# Patient Record
Sex: Female | Born: 1955 | Smoking: Former smoker
Health system: Southern US, Community
[De-identification: ages and names within clinical notes are randomized; demographics above are authoritative.]

## PROBLEM LIST (undated history)

## (undated) DIAGNOSIS — R945 Abnormal results of liver function studies: Secondary | ICD-10-CM

## (undated) DIAGNOSIS — F329 Major depressive disorder, single episode, unspecified: Secondary | ICD-10-CM

## (undated) DIAGNOSIS — F1020 Alcohol dependence, uncomplicated: Secondary | ICD-10-CM

## (undated) DIAGNOSIS — R079 Chest pain, unspecified: Secondary | ICD-10-CM

## (undated) DIAGNOSIS — I200 Unstable angina: Secondary | ICD-10-CM

## (undated) DIAGNOSIS — I4891 Unspecified atrial fibrillation: Secondary | ICD-10-CM

## (undated) DIAGNOSIS — J449 Chronic obstructive pulmonary disease, unspecified: Secondary | ICD-10-CM

## (undated) DIAGNOSIS — I739 Peripheral vascular disease, unspecified: Secondary | ICD-10-CM

## (undated) DIAGNOSIS — E871 Hypo-osmolality and hyponatremia: Secondary | ICD-10-CM

## (undated) DIAGNOSIS — R002 Palpitations: Secondary | ICD-10-CM

## (undated) DIAGNOSIS — F1320 Sedative, hypnotic or anxiolytic dependence, uncomplicated: Secondary | ICD-10-CM

## (undated) DIAGNOSIS — C569 Malignant neoplasm of unspecified ovary: Secondary | ICD-10-CM

## (undated) DIAGNOSIS — R0609 Other forms of dyspnea: Secondary | ICD-10-CM

## (undated) HISTORY — PX: ORTHOPEDIC SURGERY: SHX850

## (undated) HISTORY — DX: Sedative, hypnotic or anxiolytic dependence, uncomplicated: F13.20

## (undated) HISTORY — PX: CYSTECTOMY: SUR359

## (undated) HISTORY — DX: Other forms of dyspnea: R06.09

## (undated) HISTORY — DX: Unspecified atrial fibrillation: I48.91

## (undated) HISTORY — DX: Chronic obstructive pulmonary disease, unspecified: J44.9

## (undated) HISTORY — PX: APPENDECTOMY: SHX54

## (undated) HISTORY — DX: Abnormal results of liver function studies: R94.5

## (undated) HISTORY — DX: Hypo-osmolality and hyponatremia: E87.1

## (undated) HISTORY — DX: Major depressive disorder, single episode, unspecified: F32.9

## (undated) HISTORY — DX: Palpitations: R00.2

## (undated) HISTORY — DX: Malignant neoplasm of unspecified ovary: C56.9

## (undated) HISTORY — DX: Peripheral vascular disease, unspecified: I73.9

## (undated) HISTORY — PX: ABDOMINAL HYSTERECTOMY: SHX81

## (undated) HISTORY — DX: Chest pain, unspecified: R07.9

## (undated) HISTORY — DX: Unstable angina: I20.0

## (undated) HISTORY — DX: Alcohol dependence, uncomplicated: F10.20

---

## 2016-07-12 DIAGNOSIS — R945 Abnormal results of liver function studies: Secondary | ICD-10-CM

## 2016-07-12 DIAGNOSIS — F1320 Sedative, hypnotic or anxiolytic dependence, uncomplicated: Secondary | ICD-10-CM

## 2016-07-12 DIAGNOSIS — F1020 Alcohol dependence, uncomplicated: Secondary | ICD-10-CM

## 2016-07-12 DIAGNOSIS — E871 Hypo-osmolality and hyponatremia: Secondary | ICD-10-CM

## 2016-07-12 DIAGNOSIS — R7989 Other specified abnormal findings of blood chemistry: Secondary | ICD-10-CM

## 2016-07-12 HISTORY — DX: Hypo-osmolality and hyponatremia: E87.1

## 2016-07-12 HISTORY — DX: Alcohol dependence, uncomplicated: F10.20

## 2016-07-12 HISTORY — DX: Other specified abnormal findings of blood chemistry: R79.89

## 2016-07-12 HISTORY — DX: Sedative, hypnotic or anxiolytic dependence, uncomplicated: F13.20

## 2017-12-30 DIAGNOSIS — I480 Paroxysmal atrial fibrillation: Secondary | ICD-10-CM | POA: Diagnosis not present

## 2017-12-30 DIAGNOSIS — G8191 Hemiplegia, unspecified affecting right dominant side: Secondary | ICD-10-CM | POA: Diagnosis not present

## 2017-12-30 DIAGNOSIS — K219 Gastro-esophageal reflux disease without esophagitis: Secondary | ICD-10-CM | POA: Diagnosis not present

## 2017-12-30 DIAGNOSIS — I10 Essential (primary) hypertension: Secondary | ICD-10-CM | POA: Diagnosis not present

## 2017-12-30 DIAGNOSIS — J449 Chronic obstructive pulmonary disease, unspecified: Secondary | ICD-10-CM | POA: Diagnosis not present

## 2017-12-31 DIAGNOSIS — I10 Essential (primary) hypertension: Secondary | ICD-10-CM | POA: Diagnosis not present

## 2017-12-31 DIAGNOSIS — J449 Chronic obstructive pulmonary disease, unspecified: Secondary | ICD-10-CM | POA: Diagnosis not present

## 2017-12-31 DIAGNOSIS — I480 Paroxysmal atrial fibrillation: Secondary | ICD-10-CM | POA: Diagnosis not present

## 2017-12-31 DIAGNOSIS — I6789 Other cerebrovascular disease: Secondary | ICD-10-CM | POA: Diagnosis not present

## 2017-12-31 DIAGNOSIS — G8191 Hemiplegia, unspecified affecting right dominant side: Secondary | ICD-10-CM | POA: Diagnosis not present

## 2017-12-31 DIAGNOSIS — K219 Gastro-esophageal reflux disease without esophagitis: Secondary | ICD-10-CM | POA: Diagnosis not present

## 2018-01-01 DIAGNOSIS — I10 Essential (primary) hypertension: Secondary | ICD-10-CM | POA: Diagnosis not present

## 2018-01-01 DIAGNOSIS — G8191 Hemiplegia, unspecified affecting right dominant side: Secondary | ICD-10-CM | POA: Diagnosis not present

## 2018-01-01 DIAGNOSIS — I480 Paroxysmal atrial fibrillation: Secondary | ICD-10-CM | POA: Diagnosis not present

## 2018-01-01 DIAGNOSIS — J449 Chronic obstructive pulmonary disease, unspecified: Secondary | ICD-10-CM | POA: Diagnosis not present

## 2018-01-02 DIAGNOSIS — I480 Paroxysmal atrial fibrillation: Secondary | ICD-10-CM | POA: Diagnosis not present

## 2018-01-02 DIAGNOSIS — J449 Chronic obstructive pulmonary disease, unspecified: Secondary | ICD-10-CM | POA: Diagnosis not present

## 2018-01-02 DIAGNOSIS — G8191 Hemiplegia, unspecified affecting right dominant side: Secondary | ICD-10-CM | POA: Diagnosis not present

## 2018-01-02 DIAGNOSIS — I10 Essential (primary) hypertension: Secondary | ICD-10-CM | POA: Diagnosis not present

## 2018-01-03 ENCOUNTER — Telehealth: Payer: Self-pay | Admitting: Cardiology

## 2018-01-03 DIAGNOSIS — F32A Depression, unspecified: Secondary | ICD-10-CM

## 2018-01-03 DIAGNOSIS — F329 Major depressive disorder, single episode, unspecified: Secondary | ICD-10-CM | POA: Insufficient documentation

## 2018-01-03 DIAGNOSIS — R079 Chest pain, unspecified: Secondary | ICD-10-CM

## 2018-01-03 DIAGNOSIS — I480 Paroxysmal atrial fibrillation: Secondary | ICD-10-CM

## 2018-01-03 DIAGNOSIS — R0600 Dyspnea, unspecified: Secondary | ICD-10-CM

## 2018-01-03 DIAGNOSIS — C569 Malignant neoplasm of unspecified ovary: Secondary | ICD-10-CM | POA: Insufficient documentation

## 2018-01-03 DIAGNOSIS — R0609 Other forms of dyspnea: Secondary | ICD-10-CM

## 2018-01-03 DIAGNOSIS — R002 Palpitations: Secondary | ICD-10-CM

## 2018-01-03 DIAGNOSIS — I739 Peripheral vascular disease, unspecified: Secondary | ICD-10-CM | POA: Insufficient documentation

## 2018-01-03 DIAGNOSIS — J449 Chronic obstructive pulmonary disease, unspecified: Secondary | ICD-10-CM | POA: Insufficient documentation

## 2018-01-03 DIAGNOSIS — I4891 Unspecified atrial fibrillation: Secondary | ICD-10-CM

## 2018-01-03 HISTORY — DX: Dyspnea, unspecified: R06.00

## 2018-01-03 HISTORY — DX: Palpitations: R00.2

## 2018-01-03 HISTORY — DX: Paroxysmal atrial fibrillation: I48.0

## 2018-01-03 HISTORY — DX: Other forms of dyspnea: R06.09

## 2018-01-03 HISTORY — DX: Depression, unspecified: F32.A

## 2018-01-03 HISTORY — DX: Unspecified atrial fibrillation: I48.91

## 2018-01-03 HISTORY — DX: Peripheral vascular disease, unspecified: I73.9

## 2018-01-03 HISTORY — DX: Malignant neoplasm of unspecified ovary: C56.9

## 2018-01-03 HISTORY — DX: Chest pain, unspecified: R07.9

## 2018-01-03 HISTORY — DX: Chronic obstructive pulmonary disease, unspecified: J44.9

## 2018-01-03 NOTE — Telephone Encounter (Signed)
Please call patient regarding whether to start physical therapy prior to doctor appt.

## 2018-01-03 NOTE — Telephone Encounter (Signed)
Patient advised that [facility] set up the physical therapy which means they believe she is stable to start. No need to wait until seen by Dr. Bettina Gavia. Patient verbalized understanding, no further questions.

## 2018-01-08 NOTE — Progress Notes (Signed)
Cardiology Office Note:    Date:  01/09/2018   ID:  Bari Mantis, DOB Mar 26, 1956, MRN [id number]  PCP:  Imagene Riches, NP  Cardiologist:  Shirlee More, MD    Referring MD: Imagene Riches, NP    ASSESSMENT:    1. Paroxysmal atrial fibrillation (HCC)   2. Cerebrovascular accident (CVA), unspecified mechanism ([place])    PLAN:    In order of problems listed above:  1. Clinically she has had no recurrence however if indeed she has had a stroke her risk score would be moderate at 3 she should transition from aspirin and Plavix oral anticoagulant.  Of asked her to be seen quickly by neurology to establish the diagnosis I suspect she did have a stroke by physical examination.  I will see her back in the office in 2 weeks. 2. Confusing terminology possible stroke with normal imaging clinically I think she had an acute neurologic event stroke and I asked her to be seen by neurologist to confirm the diagnosis to guide treatment if she needs an oral anticoagulant.   Next appointment: 2 weeks   Medication Adjustments/Labs and Tests Ordered: Current medicines are reviewed at length with the patient today.  Concerns regarding medicines are outlined above.  Orders Placed This Encounter  Procedures  . Ambulatory referral to Neurology   No orders of the defined types were placed in this encounter.   Chief Complaint  Patient presents with  . Hospitalization Follow-up    per [facility].  Pt has hx of PAF     History of Present Illness:    Brandon Scarbrough is a 62 y.o. female with a hx of chest discomfort , PAF 20 yrs agoand palpitation last seen by dr Jimmie Molly 11/25/15.  Assessment & Plan:  12/05/15 1. Palpitations Echocardiogram Stress Treadmill With Contrast  Holter monitor - 48 hour  2. Chest pain, unspecified Ambulatory referral to Cardiology  Echocardiogram Stress Treadmill With Contrast  3. COPD mixed type ([facility]-HCC) Echocardiogram Stress Treadmill With Contrast  4. Exertional dyspnea  Echocardiogram Stress Treadmill With Contrast   1. Episodic chest discomfort- nonspecific, consistent with angina, multiple coronary risk factors 2. Advanced COPD 3. Palpitations, history of paroxysmal atrial fib in the past  PLAN: Treadmill stress echo to eval for coronary disease, and biventricular function Holter assess nature of arrhythmia,? Paroxysmal A. Fib Ports of smoking cessation stressed  Recently at [facility] ED and admitted with stroke, but discharge summary " Possible stroke with normal imaging" Neurology placed on aspirin and plavix, 2 MRI were normal, no PAF identified during the admission and there is no documentation in Epic.Echo was unremarkable and no arrhythmia was noted.   Compliance with diet, lifestyle and medications: yes  Prior to this admission she is walking 2 miles per day she now uses a walker and is weak on her right side.  She has a history of atrial fibrillation at age 31-year after the birth of her child and took quinidine for several years.  It was stopped she has had no recurrence and was seen by one my previous partners 2-1/2 years ago.  She was in a hospital and was sent to me to make a decision she requires anticoagulation but does not have a firm diagnosis of stroke.  She gets brief episodes where her heart rates braces but is momentary not severe sustained.  She presently is taking aspirin and clopidogrel and is very interested in having dental extractions.  No chest pain syncope or shortness of breath.  Prior to visit I reviewed the [facility] records. Past Medical History:  Diagnosis Date  . Alcohol dependence, uncomplicated ([place]) 94/04/961  . Atrial fibrillation ([place]) 01/03/2018  . Benzodiazepine dependence ([place]) 07/12/2016  . Chest pain 01/03/2018  . COPD (chronic obstructive pulmonary disease) ([place]) 01/03/2018  . Depression 01/03/2018  . Dyspnea on exertion 01/03/2018  . Elevated LFTs 07/12/2016  . Hyponatremia 07/12/2016  . Ovarian cancer ([place])  01/03/2018  . Palpitations 01/03/2018  . PVD (peripheral vascular disease) ([place]) 01/03/2018    Past Surgical History:  Procedure Laterality Date  . ABDOMINAL HYSTERECTOMY    . APPENDECTOMY    . CESAREAN SECTION    . CYSTECTOMY    . ORTHOPEDIC SURGERY      Current Medications: Current Meds  Medication Sig  . ALPRAZolam (XANAX) 1 MG tablet Take 1 mg by mouth every 8 (eight) hours as needed for anxiety.  Marland Kitchen aspirin EC 81 MG tablet Take 81 mg by mouth daily.  Marland Kitchen atorvastatin (LIPITOR) 80 MG tablet TAKE 1 TABLET BY MOUTH ONCE DAILY AT 6 PM  . clopidogrel (PLAVIX) 75 MG tablet TAKE 1 TABLET BY MOUTH ONCE (1) DAILY  . MAGNESIUM OXIDE 400 PO Take 1 tablet by mouth daily.  . metoprolol tartrate (LOPRESSOR) 50 MG tablet Take 0.5 tablets by mouth 2 (two) times daily.  Marland Kitchen omeprazole (PRILOSEC) 20 MG capsule Take 20 mg by mouth daily.  . VENTOLIN HFA 108 (90 Base) MCG/ACT inhaler Inhale 2 puffs into the lungs every 6 (six) hours as needed for wheezing.     Allergies:   Penicillins and Sulfa antibiotics   Social History   Socioeconomic History  . Marital status: Unknown    Spouse name: Not on file  . Number of children: Not on file  . Years of education: Not on file  . Highest education level: Not on file  Occupational History  . Not on file  Social Needs  . Financial resource strain: Not on file  . Food insecurity:    Worry: Not on file    Inability: Not on file  . Transportation needs:    Medical: Not on file    Non-medical: Not on file  Tobacco Use  . Smoking status: Former Smoker    Packs/day: 0.50    Types: Cigarettes  . Smokeless tobacco: Never Used  Substance and Sexual Activity  . Alcohol use: Not Currently  . Drug use: Not Currently  . Sexual activity: Not on file  Lifestyle  . Physical activity:    Days per week: Not on file    Minutes per session: Not on file  . Stress: Not on file  Relationships  . Social connections:    Talks on phone: Not on file    Gets  together: Not on file    Attends religious service: Not on file    Active member of club or organization: Not on file    Attends meetings of clubs or organizations: Not on file    Relationship status: Not on file  Other Topics Concern  . Not on file  Social History Narrative  . Not on file     Family History: The patient's family history includes Aneurysm in her father; Diabetes in her brother and maternal uncle; Stroke in her mother. ROS:   Please see the history of present illness.    All other systems reviewed and are negative.  EKGs/Labs/Other Studies Reviewed:    The following studies were reviewed today  Holter monitor 11/25/15:  CONCLUSIONS: 1.Essentially normal, no significant abnormalities Holter monitor.  2.Mild arrhythmiaas described:Rare PVCs and one 5-beat run of supraventricular tachycardia 3.Symptoms were not reported   Recent Labs: No results found for requested labs within last 8760 hours.  Recent Lipid Panel No results found for: CHOL, TRIG, HDL, CHOLHDL, VLDL, LDLCALC, LDLDIRECT  Physical Exam:    VS:  BP 128/78 (BP Location: Left Arm, Patient Position: Sitting, Cuff Size: Normal)   Pulse 68   Ht 5' 1.5" (1.562 m)   Wt 152 lb 6.4 oz (69.1 kg)   SpO2 97%   BMI 28.33 kg/m     Wt Readings from Last 3 Encounters:  01/09/18 152 lb 6.4 oz (69.1 kg)     GEN:  Well nourished, well developed in no acute distress HEENT: Normal NECK: No JVD; No carotid bruits LYMPHATICS: No lymphadenopathy CARDIAC: RRR, no murmurs, rubs, gallops RESPIRATORY:  Clear to auscultation without rales, wheezing or rhonchi  ABDOMEN: Soft, non-tender, non-distended MUSCULOSKELETAL:  No edema; No deformity  SKIN: Warm and dry NEUROLOGIC:  Alert and oriented x 3, RUE RLE weakness with drift, tongue deviates to the right and speech is mildly dysarthric PSYCHIATRIC:  Normal affect    Signed, Shirlee More, MD  01/09/2018 1:45 PM    [facility]

## 2018-01-09 ENCOUNTER — Encounter: Payer: Self-pay | Admitting: Cardiology

## 2018-01-09 ENCOUNTER — Ambulatory Visit: Payer: BLUE CROSS/BLUE SHIELD | Admitting: Cardiology

## 2018-01-09 VITALS — BP 128/78 | HR 68 | Ht 61.5 in | Wt 152.4 lb

## 2018-01-09 DIAGNOSIS — I639 Cerebral infarction, unspecified: Secondary | ICD-10-CM

## 2018-01-09 DIAGNOSIS — I480 Paroxysmal atrial fibrillation: Secondary | ICD-10-CM | POA: Diagnosis not present

## 2018-01-09 NOTE — Patient Instructions (Signed)
Medication Instructions:  Your physician recommends that you continue on your current medications as directed. Please refer to the Current Medication list given to you today.  Labwork: None ordered  Testing/Procedures: None ordered  Follow-Up: Your physician recommends that you schedule a follow-up appointment in: 2 weeks with Dr. Bettina Gavia   Any Other Special Instructions Will Be Listed Below (If Applicable).  A referral has been placed to [facility] Neurology. They will contact you to schedule an appointment    If you need a refill on your cardiac medications before your next appointment, please call your pharmacy.

## 2018-01-15 ENCOUNTER — Other Ambulatory Visit: Payer: Self-pay

## 2018-01-15 ENCOUNTER — Ambulatory Visit: Payer: BLUE CROSS/BLUE SHIELD | Admitting: Neurology

## 2018-01-15 ENCOUNTER — Encounter: Payer: Self-pay | Admitting: Neurology

## 2018-01-15 VITALS — BP 114/68 | HR 68 | Resp 18 | Ht 61.5 in | Wt 151.0 lb

## 2018-01-15 DIAGNOSIS — R531 Weakness: Secondary | ICD-10-CM | POA: Diagnosis not present

## 2018-01-15 DIAGNOSIS — I480 Paroxysmal atrial fibrillation: Secondary | ICD-10-CM | POA: Diagnosis not present

## 2018-01-15 DIAGNOSIS — R200 Anesthesia of skin: Secondary | ICD-10-CM

## 2018-01-15 DIAGNOSIS — R002 Palpitations: Secondary | ICD-10-CM | POA: Diagnosis not present

## 2018-01-15 HISTORY — DX: Weakness: R53.1

## 2018-01-15 HISTORY — DX: Anesthesia of skin: R20.0

## 2018-01-15 NOTE — Progress Notes (Signed)
[facility]  PATIENT: Megan Acosta DOB: [birth year]  REFERRING DOCTOR OR PCP:  Dr. Bettina Gavia (Cardiology); PCP is Heide Scales SOURCE: patient, notes from Dr. Bettina Gavia  _________________________________   HISTORICAL  CHIEF COMPLAINT:  Chief Complaint  Patient presents with  . Cerebrovascular Accident    On 01/01/18, began having left sided facial tingling, right sided weakness. Seen and treated at [facility] where she sts. MRI showed "mild stroke."  Still has right sided weakness, ambultory with walker. Sts. she has seen Dr. Bettina Gavia (cardiology), and referred to [facility]. Pt. currently on ASA 81mg  and Plavix 75mg  daily, Metoprolol and Lipitor.  Hx. of A-Fib, ETOH abuse (quit 07/12/16),.  Smoked smoking 105/17/fim    HISTORY OF PRESENT ILLNESS:  I had the pleasure seeing your patient, Megan Acosta, and [facility] for neurologic consultation regarding a recent stroke.  On 01/01/2018, she presented to the ED at [facility] after presenting with right sided weakness, arm worse than face and facial tingling.  Initial CT scan was normal.  She also had an MRI of the brain twice during her admission and neither of them showed any evidence of new or old stroke.  Carotid Dopplers showed minimal nonhemodynamically significant stenosis.  Echocardiogram showed borderline concentric left ventricular hypertrophy, normal left ventricular systolic function, borderline right atrial enlargement, right ventricular enlargement.  Bubble study did not show any evidence of shunt.   She was evaluated by physical therapy and felt that she was doing better while in the hospital.  She was discharged after 3 or 4 days.   She was told on discharge summary "You were seen for a possible stroke.   Your imaging was negative, but your symptoms are concerning for stroke".      She was discharged on aspirin and clopidogrel.     She improved some while in the hospital and feels she has  improved a little further the past week.    She is doing PT at [facility] in [place].        She has a history of atrial fibrillation many years ago and reportedly was on quinidine for a few years.   She gets palpitations when she is nervous but no recent EKG has shown any   She saw Dr. Bettina Gavia 01/09/2018 (has seen Dr. Jimmie Molly previously).     She has a history of ETOH abuse but is not currently drinking.  She also quit smoking last year.   She notes some stress with her son having issues with drugs.       REVIEW OF SYSTEMS: Constitutional: No fevers, chills, sweats, or change in appetite Eyes: No visual changes, double vision, eye pain Ear, nose and throat: No hearing loss, ear pain, nasal congestion, sore throat Cardiovascular: As above Respiratory: No shortness of breath at rest or with exertion.   No wheezes GastrointestinaI: No nausea, vomiting, diarrhea, abdominal pain, fecal incontinence Genitourinary: No dysuria, urinary retention or frequency.  No nocturia. Musculoskeletal: No neck pain, back pain Integumentary: No rash, pruritus, skin lesions Neurological: as above Psychiatric: She denies depression but notes some stress Endocrine: No palpitations, diaphoresis, change in appetite, change in weigh or increased thirst Hematologic/Lymphatic: No anemia, purpura, petechiae. Allergic/Immunologic: No itchy/runny eyes, nasal congestion, recent allergic reactions, rashes  ALLERGIES: Allergies  Allergen Reactions  . Penicillins Hives    Patient states she faints after taking medication  . Sulfa Antibiotics Hives    HOME MEDICATIONS:  Current Outpatient Medications:  .  ALPRAZolam Duanne Moron) 1  MG tablet, Take 1 mg by mouth every 8 (eight) hours as needed for anxiety., Disp: , Rfl: 2 .  aspirin EC 81 MG tablet, Take 81 mg by mouth daily., Disp: , Rfl:  .  atorvastatin (LIPITOR) 80 MG tablet, TAKE 1 TABLET BY MOUTH ONCE DAILY AT 6 PM, Disp: , Rfl: 0 .  clopidogrel (PLAVIX) 75 MG  tablet, TAKE 1 TABLET BY MOUTH ONCE (1) DAILY, Disp: , Rfl: 0 .  MAGNESIUM OXIDE 400 PO, Take 1 tablet by mouth daily., Disp: , Rfl:  .  metoprolol tartrate (LOPRESSOR) 50 MG tablet, Take 0.5 tablets by mouth 2 (two) times daily., Disp: , Rfl: 2 .  omeprazole (PRILOSEC) 20 MG capsule, Take 20 mg by mouth daily., Disp: , Rfl:  .  VENTOLIN HFA 108 (90 Base) MCG/ACT inhaler, Inhale 2 puffs into the lungs every 6 (six) hours as needed for wheezing., Disp: , Rfl: 0  PAST MEDICAL HISTORY: Past Medical History:  Diagnosis Date  . Alcohol dependence, uncomplicated ([place]) 61/01/6961  . Atrial fibrillation ([place]) 01/03/2018  . Benzodiazepine dependence ([place]) 07/12/2016  . Chest pain 01/03/2018  . COPD (chronic obstructive pulmonary disease) ([place]) 01/03/2018  . Depression 01/03/2018  . Dyspnea on exertion 01/03/2018  . Elevated LFTs 07/12/2016  . Hyponatremia 07/12/2016  . Ovarian cancer ([place]) 01/03/2018  . Palpitations 01/03/2018  . PVD (peripheral vascular disease) ([place]) 01/03/2018    PAST SURGICAL HISTORY: Past Surgical History:  Procedure Laterality Date  . ABDOMINAL HYSTERECTOMY    . APPENDECTOMY    . CESAREAN SECTION    . CYSTECTOMY    . ORTHOPEDIC SURGERY      FAMILY HISTORY: Family History  Problem Relation Age of Onset  . Stroke Mother   . Aneurysm Father   . Stroke Father   . Diabetes Brother   . Heart attack Brother   . Diabetes Maternal Uncle   . Stroke Sister   . Stroke Sister   . Heart attack Brother   . Diabetes Brother   . Diabetes Brother   . Healthy Brother     SOCIAL HISTORY:  Social History   Socioeconomic History  . Marital status: Unknown    Spouse name: Not on file  . Number of children: Not on file  . Years of education: Not on file  . Highest education level: Not on file  Occupational History  . Not on file  Social Needs  . Financial resource strain: Not on file  . Food insecurity:    Worry: Not on file    Inability: Not on file  . Transportation  needs:    Medical: Not on file    Non-medical: Not on file  Tobacco Use  . Smoking status: Former Smoker    Packs/day: 0.50    Types: Cigarettes  . Smokeless tobacco: Never Used  Substance and Sexual Activity  . Alcohol use: Not Currently    Comment: Pt. reports hx. of alcoholism, last ETOH 07/12/16  . Drug use: Not Currently  . Sexual activity: Not on file  Lifestyle  . Physical activity:    Days per week: Not on file    Minutes per session: Not on file  . Stress: Not on file  Relationships  . Social connections:    Talks on phone: Not on file    Gets together: Not on file    Attends religious service: Not on file    Active member of club or organization: Not on file    Attends meetings  of clubs or organizations: Not on file    Relationship status: Not on file  . Intimate partner violence:    Fear of current or ex partner: Not on file    Emotionally abused: Not on file    Physically abused: Not on file    Forced sexual activity: Not on file  Other Topics Concern  . Not on file  Social History Narrative  . Not on file     PHYSICAL EXAM  Vitals:   01/15/18 1112  BP: 114/68  Pulse: 68  Resp: 18  Weight: 151 lb (68.5 kg)  Height: 5' 1.5" (1.562 m)    Body mass index is 28.07 kg/m.   General: The patient is well-developed and well-nourished and in no acute distress  Eyes:  Funduscopic exam shows normal optic discs and retinal vessels.  Neck: The neck is supple, no carotid bruits are noted.  The neck is nontender.  Cardiovascular: The heart has a regular rate and rhythm with a normal S1 and S2. There were no murmurs, gallops or rubs. Lungs are clear to auscultation.  Skin: Extremities are without significant edema.  Musculoskeletal:  Back is nontender  Neurologic Exam  Mental status: The patient is alert and oriented x 3 at the time of the examination. The patient has apparent normal recent and remote memory, with an apparently normal attention span and  concentration ability.   Speech is normal.  Cranial nerves: Extraocular movements are full. Pupils are equal, round, and reactive to light and accomodation.  Visual fields are full.  Facial symmetry is present.  She reported reduced sensation to touch on the right face versus the left.  She reported reduced forehead vibration on the right versus the left.Facial strength is normal.  Trapezius and sternocleidomastoid strength is normal. No dysarthria is noted.  The tongue is midline, and the patient has symmetric elevation of the soft palate. No obvious hearing deficits are noted.  Motor:  Muscle bulk is normal.   Tone is normal.  Strength appeared normal on the left.  She had very variable strength on the right side of her body.  During formal unilateral testing right strength was +/-  2/5.  With bilateral formal testing she often did better on the right.  With distraction strength was often at least 4+/5.   As examples, when she was putting her papers away at the end of the visit the left and right arm were equal in strength.  When putting her shoe back on she was able to lift her right leg much better than during formal testing.  Arm rolling was symmetric when she continued to move her arms after I moved them for her.     Sensory: She reported reduced sensation to vibration, touch and temperature in the right arm and leg versus the left.  Coordination: Cerebellar testing showed reduced finger-nose-finger but was symmetric heel to shin.  This was variable..  Gait and station: Station is normal.   She had an unusual steppage gait.  When I held onto her while she walked right foot drop was better.  Romberg is negative.   Reflexes: Deep tendon reflexes are symmetric and normal bilaterally.   Plantar responses are flexor.    DIAGNOSTIC DATA (LABS, IMAGING, TESTING) - I reviewed patient records, labs, notes, testing and imaging myself where available.      ASSESSMENT AND PLAN  Paroxysmal atrial  fibrillation (HCC)  Right sided weakness  Right sided numbness  Functional weakness  Palpitations  In summary, Mrs. Mcgillis is a 62 year old woman who presented with right-sided weakness 2 weeks ago.  She was hospitalized for several days and diagnosed with a possible stroke, though there was absolutely no evidence of new or old stroke on MRI.  The MRI was even repeated a day after admission without any changes.  On her exam today, she has clear functional weakness on the right with improved function when distracted.  I cannot rule out that there is some weakness on the right though at times strength appeared normal or near-normal.  Therefore, it is possible that she is having psychogenic stroke symptoms.  Since there is no evidence of changes on MRI, I would not recommend conversion from antiplatelet drugs to stronger anticoagulation.   Continuation on aspirin is reasonable.  If there is no cardiac reason to be on both aspirin and Plavix, then the Plavix could also be discontinued.   I let her know that usually when people have stroke symptoms but no evidence of stroke on MRI and they have variability in their examination that they are neurologically much better off than they may appear.  I would expect her to make continued improvement with physical therapy and I advised her to continue.   I have requested the actual MRI images for personal review  I did not schedule a follow-up but advised her to call our office if she has any significantly new or worsening neurologic symptoms.   Richard A. Felecia Shelling, MD, [facility] 10/23/5788, 3:83 PM Certified in Neurology, Clinical Neurophysiology, Sleep Medicine, Pain Medicine and Neuroimaging  [facility] [address], [place], [place] [address] [phone]

## 2018-01-27 NOTE — Progress Notes (Signed)
Cardiology Office Note:    Date:  01/28/2018   ID:  Erin Obando, DOB Nov 12, 1955, MRN [id number]  PCP:  Imagene Riches, NP  Cardiologist:  Shirlee More, MD    Referring MD: Imagene Riches, NP    ASSESSMENT:    1. Paroxysmal atrial fibrillation (HCC)   2. PVD (peripheral vascular disease) (HCC)   3. Palpitations   4. Dyslipidemia    PLAN:    In order of problems listed above:  1. Stable maintaining sinus rhythm she is having brief episodes of palpitation and I advised her to consider getting iPhone adapter to screen herself for atrial fibrillation.  After neurology consultation I would not commit her to lifelong anticoagulation she will continue aspirin and clopidogrel 2. She has diminished pulse and blood pressure in the right arm will have a vascular duplex performed 3. See discussion under #1 4. Continue statin   Next appointment: 6 months   Medication Adjustments/Labs and Tests Ordered: Current medicines are reviewed at length with the patient today.  Concerns regarding medicines are outlined above.  No orders of the defined types were placed in this encounter.  Meds ordered this encounter  Medications  . rosuvastatin (CRESTOR) 20 MG tablet    Sig: Take 1 tablet (20 mg total) by mouth daily.    Dispense:  90 tablet    Refill:  3    Chief Complaint  Patient presents with  . Follow-up  . Atrial Fibrillation    History of Present Illness:    Murray Guzzetta is a 62 y.o. female with a hx of chest discomfort , PAF 20 yrs ago and palpitation last seen 01/09/18.  ASSESSMENT:    1. Paroxysmal atrial fibrillation (HCC)   2. Cerebrovascular accident (CVA), unspecified mechanism ([place])    PLAN:    1. Clinically she has had no recurrence however if indeed she has had a stroke her risk score would be moderate at 3 she should transition from aspirin and Plavix oral anticoagulant.  Of asked her to be seen quickly by neurology to establish the diagnosis I suspect she did  have a stroke by physical examination.  I will see her back in the office in 2 weeks. 2. Confusing terminology possible stroke with normal imaging clinically I think she had an acute neurologic event stroke and I asked her to be seen by neurologist to confirm the diagnosis to guide treatment if she needs an oral anticoagulant.  She was seen by neurology 01/15/18: In summary, Mrs. Valenza is a 61 year old woman who presented with right-sided weakness 2 weeks ago.  She was hospitalized for several days and diagnosed with a possible stroke, though there was absolutely no evidence of new or old stroke on MRI.  The MRI was even repeated a day after admission without any changes.  On her exam today, she has clear functional weakness on the right with improved function when distracted.  I cannot rule out that there is some weakness on the right though at times strength appeared normal or near-normal.  Therefore, it is possible that she is having psychogenic stroke symptoms.  Since there is no evidence of changes on MRI, I would not recommend conversion from antiplatelet drugs to stronger anticoagulation.   Continuation on aspirin is reasonable.  If there is no cardiac reason to be on both aspirin and Plavix, then the Plavix could also be discontinued.   I let her know that usually when people have stroke symptoms but no evidence of stroke  on MRI and they have variability in their examination that they are neurologically much better off than they may appear.  I would expect her to make continued improvement with physical therapy and I advised her to continue.   I have requested the actual MRI images for personal review  I did not schedule a follow-up but advised her to call our office if she has any significantly new or worsening neurologic symptoms. Richard A. Sater, MD, [facility] 01/15/2018, 1:09 PM  Compliance with diet, lifestyle and medications: Yes  She continues to have right-sided weakness and is  considering applying for disability.  She also anticipates dental extractions and requested a note that I have given her that I see no contraindication.  She has had infrequent brief not severe sustained palpitation and has had no recurrent episode of neurologic deficit syncope chest pain or shortness of breath. Past Medical History:  Diagnosis Date  . Alcohol dependence, uncomplicated ([place]) 28/01/1323  . Atrial fibrillation ([place]) 01/03/2018  . Benzodiazepine dependence ([place]) 07/12/2016  . Chest pain 01/03/2018  . COPD (chronic obstructive pulmonary disease) ([place]) 01/03/2018  . Depression 01/03/2018  . Dyspnea on exertion 01/03/2018  . Elevated LFTs 07/12/2016  . Hyponatremia 07/12/2016  . Ovarian cancer ([place]) 01/03/2018  . Palpitations 01/03/2018  . PVD (peripheral vascular disease) ([place]) 01/03/2018    Past Surgical History:  Procedure Laterality Date  . ABDOMINAL HYSTERECTOMY    . APPENDECTOMY    . CESAREAN SECTION    . CYSTECTOMY    . ORTHOPEDIC SURGERY      Current Medications: Current Meds  Medication Sig  . ALPRAZolam (XANAX) 1 MG tablet Take 1 mg by mouth every 8 (eight) hours as needed for anxiety.  Marland Kitchen MAGNESIUM OXIDE 400 PO Take 1 tablet by mouth daily.  . metoprolol tartrate (LOPRESSOR) 50 MG tablet Take 0.5 tablets by mouth 2 (two) times daily.  Marland Kitchen omeprazole (PRILOSEC) 40 MG capsule TAKE 1 CAPSULE BY MOUTH ONCE (1) DAILY  . VENTOLIN HFA 108 (90 Base) MCG/ACT inhaler Inhale 2 puffs into the lungs every 6 (six) hours as needed for wheezing.  . [DISCONTINUED] atorvastatin (LIPITOR) 80 MG tablet TAKE 1 TABLET BY MOUTH ONCE DAILY AT 6 PM     Allergies:   Penicillins and Sulfa antibiotics   Social History   Socioeconomic History  . Marital status: Unknown    Spouse name: Not on file  . Number of children: Not on file  . Years of education: Not on file  . Highest education level: Not on file  Occupational History  . Not on file  Social Needs  . Financial resource strain: Not  on file  . Food insecurity:    Worry: Not on file    Inability: Not on file  . Transportation needs:    Medical: Not on file    Non-medical: Not on file  Tobacco Use  . Smoking status: Former Smoker    Packs/day: 0.50    Types: Cigarettes  . Smokeless tobacco: Never Used  Substance and Sexual Activity  . Alcohol use: Not Currently    Comment: Pt. reports hx. of alcoholism, last ETOH 07/12/16  . Drug use: Not Currently  . Sexual activity: Not on file  Lifestyle  . Physical activity:    Days per week: Not on file    Minutes per session: Not on file  . Stress: Not on file  Relationships  . Social connections:    Talks on phone: Not on file  Gets together: Not on file    Attends religious service: Not on file    Active member of club or organization: Not on file    Attends meetings of clubs or organizations: Not on file    Relationship status: Not on file  Other Topics Concern  . Not on file  Social History Narrative  . Not on file     Family History: The patient's family history includes Aneurysm in her father; Diabetes in her brother, brother, brother, and maternal uncle; Healthy in her brother; Heart attack in her brother and brother; Stroke in her father, mother, sister, and sister. ROS:   Please see the history of present illness.    All other systems reviewed and are negative.  EKGs/Labs/Other Studies Reviewed:    The following studies were reviewed today:  Recent Labs: No results found for requested labs within last 8760 hours.  Recent Lipid Panel No results found for: CHOL, TRIG, HDL, CHOLHDL, VLDL, LDLCALC, LDLDIRECT  Physical Exam:    VS:  BP 100/70 (BP Location: Right Arm, Patient Position: Sitting, Cuff Size: Normal)   Pulse 62   Ht 5' 1.5" (1.562 m)   Wt 151 lb (68.5 kg)   SpO2 96%   BMI 28.07 kg/m     Wt Readings from Last 3 Encounters:  01/28/18 151 lb (68.5 kg)  01/15/18 151 lb (68.5 kg)  01/09/18 152 lb 6.4 oz (69.1 kg)     GEN:  Well  nourished, well developed in no acute distress HEENT: Normal NECK: No JVD; No carotid bruits LYMPHATICS: No lymphadenopathy CARDIAC: RRR, no murmurs, rubs, gallops RESPIRATORY:  Clear to auscultation without rales, wheezing or rhonchi  ABDOMEN: Soft, non-tender, non-distended MUSCULOSKELETAL:  No edema; No deformity  SKIN: Warm and dry NEUROLOGIC:  Alert and oriented x 3 PSYCHIATRIC:  Normal affect    Signed, Shirlee More, MD  01/28/2018 11:53 AM    [place]

## 2018-01-28 ENCOUNTER — Encounter: Payer: Self-pay | Admitting: Cardiology

## 2018-01-28 ENCOUNTER — Ambulatory Visit: Payer: BLUE CROSS/BLUE SHIELD | Admitting: Cardiology

## 2018-01-28 VITALS — BP 100/70 | HR 62 | Ht 61.5 in | Wt 151.0 lb

## 2018-01-28 DIAGNOSIS — R002 Palpitations: Secondary | ICD-10-CM | POA: Diagnosis not present

## 2018-01-28 DIAGNOSIS — I480 Paroxysmal atrial fibrillation: Secondary | ICD-10-CM

## 2018-01-28 DIAGNOSIS — I739 Peripheral vascular disease, unspecified: Secondary | ICD-10-CM

## 2018-01-28 DIAGNOSIS — E785 Hyperlipidemia, unspecified: Secondary | ICD-10-CM

## 2018-01-28 HISTORY — DX: Hyperlipidemia, unspecified: E78.5

## 2018-01-28 MED ORDER — ROSUVASTATIN CALCIUM 20 MG PO TABS: 20.0000 mg | ORAL_TABLET | Freq: Every day | ORAL | 3 refills | Status: DC

## 2018-01-28 NOTE — Patient Instructions (Addendum)
Medication Instructions:  Your physician has recommended you make the following change in your medication:  STOP atorvastatin  START rosuvastatin (Crestor) 20 mg daily  Labwork: None  Testing/Procedures: Your physician has requested that you have an upper extremity arterial duplex. This test is an ultrasound of the arteries in the arms. It looks at arterial blood flow in the arms. Allow one hour for Upper Arterial scans. There are no restrictions or special instructions  Follow-Up: Your physician wants you to follow-up in: 6 months. You will receive a reminder letter in the mail two months in advance. If you don't receive a letter, please call our office to schedule the follow-up appointment.  Any Other Special Instructions Will Be Listed Below (If Applicable).     If you need a refill on your cardiac medications before your next appointment, please call your pharmacy.    KardiaMobile [email or web address]        FDA-cleared, clinical grade mobile EKG monitor: Jodelle Red is the most clinically-validated mobile EKG used by the world's leading cardiac care medical professionals With Basic service, know instantly if your heart rhythm is normal or if atrial fibrillation is detected, and email the last single EKG recording to yourself or your doctor Premium service, available for purchase through the Kardia app for $9.99 per month or $99 per year, includes unlimited history and storage of your EKG recordings, a monthly EKG summary report to share with your doctor, along with the ability to track your blood pressure, activity and weight Includes one KardiaMobile phone clip FREE SHIPPING: Standard delivery 1-3 business days. Orders placed by 11:00am PST will ship that afternoon. Otherwise, will ship next business day. All orders ship via [employer] from [place], [place]

## 2018-01-31 ENCOUNTER — Ambulatory Visit (HOSPITAL_BASED_OUTPATIENT_CLINIC_OR_DEPARTMENT_OTHER)
Admission: RE | Admit: 2018-01-31 | Discharge: 2018-01-31 | Disposition: A | Discharge status: Home / Self Care | Payer: BLUE CROSS/BLUE SHIELD | Source: Ambulatory Visit | Attending: Cardiology | Admitting: Cardiology

## 2018-01-31 DIAGNOSIS — I739 Peripheral vascular disease, unspecified: Secondary | ICD-10-CM | POA: Diagnosis not present

## 2018-01-31 DIAGNOSIS — I70208 Unspecified atherosclerosis of native arteries of extremities, other extremity: Secondary | ICD-10-CM | POA: Insufficient documentation

## 2018-01-31 DIAGNOSIS — E785 Hyperlipidemia, unspecified: Secondary | ICD-10-CM | POA: Insufficient documentation

## 2018-01-31 DIAGNOSIS — I4891 Unspecified atrial fibrillation: Secondary | ICD-10-CM | POA: Insufficient documentation

## 2018-01-31 NOTE — Progress Notes (Signed)
  Upper extremity arterial unilateral right. Significant stenosis of then proximal brachial artery. Joelene Millin 01/31/2018, 12:47 PM

## 2018-02-05 ENCOUNTER — Telehealth: Payer: Self-pay | Admitting: Cardiology

## 2018-02-05 DIAGNOSIS — R200 Anesthesia of skin: Secondary | ICD-10-CM

## 2018-02-05 DIAGNOSIS — R0989 Other specified symptoms and signs involving the circulatory and respiratory systems: Secondary | ICD-10-CM

## 2018-02-05 DIAGNOSIS — I739 Peripheral vascular disease, unspecified: Secondary | ICD-10-CM

## 2018-02-05 NOTE — Telephone Encounter (Signed)
Patient informed of results. Patient agreed to vascular surgeon referral. Referral placed. Advised patient she would receive a phone call to schedule appointment. Patient verbalized understanding. No further questions.

## 2018-02-05 NOTE — Telephone Encounter (Signed)
-----   Message from Richardo Priest, MD sent at 02/05/2018 11:27 AM EDT ----- Abnormal, likely cuase of weakness RUE refer to vascular surgery dr Trula Slade ----- Message ----- From: Jossie Ng, RN Sent: 02/05/2018  11:08 AM To: Richardo Priest, MD  Please advise on result

## 2018-02-05 NOTE — Telephone Encounter (Signed)
Need results of pvd test

## 2018-03-19 ENCOUNTER — Encounter: Payer: Self-pay | Admitting: Vascular Surgery

## 2018-03-19 ENCOUNTER — Other Ambulatory Visit: Payer: Self-pay

## 2018-03-19 ENCOUNTER — Other Ambulatory Visit: Payer: Self-pay | Admitting: *Deleted

## 2018-03-19 ENCOUNTER — Ambulatory Visit: Payer: BLUE CROSS/BLUE SHIELD | Admitting: Vascular Surgery

## 2018-03-19 ENCOUNTER — Encounter: Payer: Self-pay | Admitting: *Deleted

## 2018-03-19 VITALS — BP 123/76 | HR 64 | Temp 98.9°F | Resp 18 | Ht 61.5 in | Wt 142.0 lb

## 2018-03-19 DIAGNOSIS — I70218 Atherosclerosis of native arteries of extremities with intermittent claudication, other extremity: Secondary | ICD-10-CM

## 2018-03-19 NOTE — H&P (View-Only) (Signed)
Patient name: Megan Acosta MRN: [id number] DOB: 1956/06/29 Sex: female   REASON FOR CONSULT:    Abnormal upper extremity arterial Doppler study.  The consult is requested by Dr. Bettina Gavia.  HPI:   Megan Acosta is a pleasant 62 y.o. female, who presents with weakness and numbness in her right arm.  On my history she describes the pain on the right side of her head, that radiates down her right neck, down to her arm, and then all the way down to her right second toe which feels like it is burning.  With respect to her right upper extremity she describes some paresthesias in the right upper extremity and states that the arm just does not work like it should.  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  Apparently she had been hospitalized in March with a possible stroke but underwent an extensive work-up and had no evidence of an acute stroke.  She did right-sided weakness at that time also.  At that time it also involved her leg however.   I do not get any history of claudication, rest pain, or nonhealing ulcers.  The patient does have a history of paroxysmal atrial fibrillation but to the best of her knowledge she has not had problems with A. fib for some time.  Her risk factors for peripheral vascular disease include a family history of premature cardiovascular disease and a smoking history.  She quit tobacco 2 years ago.  She denies any history of diabetes, hypertension, or hypercholesterolemia.  Past Medical History:  Diagnosis Date  . Alcohol dependence, uncomplicated ([place]) 75/06/1637  . Atrial fibrillation ([place]) 01/03/2018  . Benzodiazepine dependence ([place]) 07/12/2016  . Chest pain 01/03/2018  . COPD (chronic obstructive pulmonary disease) ([place]) 01/03/2018  . Depression 01/03/2018  . Dyspnea on exertion 01/03/2018  . Elevated LFTs 07/12/2016  . Hyponatremia 07/12/2016  . Ovarian cancer ([place]) 01/03/2018  . Palpitations  01/03/2018  . PVD (peripheral vascular disease) ([place]) 01/03/2018    Family History  Problem Relation Age of Onset  . Stroke Mother   . Aneurysm Father   . Stroke Father   . Diabetes Brother   . Heart attack Brother   . Diabetes Maternal Uncle   . Heart disease Sister   . Stroke Sister   . Stroke Sister   . Heart attack Brother   . Heart disease Brother   . Diabetes Brother   . Diabetes Brother   . Healthy Brother     SOCIAL HISTORY: Social History   Socioeconomic History  . Marital status: Unknown    Spouse name: Not on file  . Number of children: Not on file  . Years of education: Not on file  . Highest education level: Not on file  Occupational History  . Not on file  Social Needs  . Financial resource strain: Not on file  . Food insecurity:    Worry: Not on file    Inability: Not on file  . Transportation needs:    Medical: Not on file    Non-medical: Not on file  Tobacco Use  . Smoking status: Former Smoker    Packs/day: 0.50    Types: Cigarettes  . Smokeless tobacco: Never Used  Substance and Sexual Activity  . Alcohol use: Not Currently    Comment: Pt. reports hx. of alcoholism, last ETOH 07/12/16  . Drug use: Not Currently  . Sexual  activity: Not on file  Lifestyle  . Physical activity:    Days per week: Not on file    Minutes per session: Not on file  . Stress: Not on file  Relationships  . Social connections:    Talks on phone: Not on file    Gets together: Not on file    Attends religious service: Not on file    Active member of club or organization: Not on file    Attends meetings of clubs or organizations: Not on file    Relationship status: Not on file  . Intimate partner violence:    Fear of current or ex partner: Not on file    Emotionally abused: Not on file    Physically abused: Not on file    Forced sexual activity: Not on file  Other Topics Concern  . Not on file  Social History Narrative  . Not on file    Allergies  Allergen  Reactions  . Penicillins Hives    Patient states she faints after taking medication  . Sulfa Antibiotics Hives    Current Outpatient Medications  Medication Sig Dispense Refill  . ALPRAZolam (XANAX) 1 MG tablet Take 1 mg by mouth every 8 (eight) hours as needed for anxiety.  2  . aspirin EC 81 MG tablet Take 81 mg by mouth daily.    Marland Kitchen MAGNESIUM OXIDE 400 PO Take 1 tablet by mouth daily.    . rosuvastatin (CRESTOR) 20 MG tablet Take 1 tablet (20 mg total) by mouth daily. 90 tablet 3  . clopidogrel (PLAVIX) 75 MG tablet TAKE 1 TABLET BY MOUTH ONCE (1) DAILY  0  . metoprolol tartrate (LOPRESSOR) 50 MG tablet Take 0.5 tablets by mouth 2 (two) times daily.  2  . omeprazole (PRILOSEC) 40 MG capsule TAKE 1 CAPSULE BY MOUTH ONCE (1) DAILY  2  . VENTOLIN HFA 108 (90 Base) MCG/ACT inhaler Inhale 2 puffs into the lungs every 6 (six) hours as needed for wheezing.  0   No current facility-administered medications for this visit.     REVIEW OF SYSTEMS:  [X]  denotes positive finding, [ ]  denotes negative finding Cardiac  Comments:  Chest pain or chest pressure:    Shortness of breath upon exertion: x   Short of breath when lying flat:    Irregular heart rhythm: x       Vascular    Pain in calf, thigh, or hip brought on by ambulation:    Pain in feet at night that wakes you up from your sleep:     Blood clot in your veins:    Leg swelling:         Pulmonary    Oxygen at home:    Productive cough:  x   Wheezing:  x       Neurologic    Sudden weakness in arms or legs:  x   Sudden numbness in arms or legs:  x   Sudden onset of difficulty speaking or slurred speech: x   Temporary loss of vision in one eye:     Problems with dizziness:         Gastrointestinal    Blood in stool:     Vomited blood:         Genitourinary    Burning when urinating:     Blood in urine:        Psychiatric    Major depression:         Hematologic  Bleeding problems:    Problems with blood clotting  too easily:        Skin    Rashes or ulcers:        Constitutional    Fever or chills:     PHYSICAL EXAM:   There were no vitals filed for this visit.  GENERAL: The patient is a well-nourished female, in no acute distress. The vital signs are documented above. CARDIAC: There is a regular rate and rhythm.  VASCULAR: I do not detect carotid bruits or supraclavicular bruits. I cannot palpate a right radial pulse. She has a palpable left brachial and radial pulse. She has palpable femoral pulses.  I cannot palpate popliteal or pedal pulses. On the right side she has a biphasic posterior tibial signal with the Doppler and a brisk dorsalis pedis signal.  On the left side she has brisk monophasic Doppler signals in the dorsalis pedis and posterior tibial positions. PULMONARY: There is good air exchange bilaterally without wheezing or rales. ABDOMEN: Soft and non-tender with normal pitched bowel sounds.  MUSCULOSKELETAL: There are no major deformities or cyanosis. NEUROLOGIC: No focal weakness or paresthesias are detected. SKIN: There are no ulcers or rashes noted. PSYCHIATRIC: The patient has a normal affect.  DATA:    CAROTID DUPLEX: I have reviewed the carotid duplex scan that was done at [facility] on 12/31/2017.  There was no significant carotid disease bilaterally.  Both vertebral arteries were patent with antegrade flow.  UPPER EXTREMITY DUPLEX: I have also reviewed the upper extremity arterial duplex that was done on 01/31/2018.  This showed triphasic waveforms in the right subclavian artery with a biphasic axillary signal and monophasic signals from the brachial level down.  There was a hemodynamically significant stenosis noted in the proximal right brachial artery.  Hello of the right  MEDICAL ISSUES:   ATHEROSCLEROSIS OF RIGHT BRACHIAL ARTERY: Duplex scan and physical exam suggest significant disease of her brachial artery on the right side which likely explains her symptoms  of weakness and numbness.  Her carotid duplex scan was completely normal.  Given that her symptoms are quite disabling I have recommended we proceed with an arteriogram.  I have reviewed with the patient the indications for arteriography. In addition, I have reviewed the potential complications of arteriography including but not limited to: Bleeding, arterial injury, arterial thrombosis, dye action, renal insufficiency, or other unpredictable medical problems. I have explained to the patient that if we find disease amenable to angioplasty we could potentially address this at the same time. I have discussed the potential complications of angioplasty and stenting, including but not limited to: Bleeding, arterial thrombosis, arterial injury, dissection, or the need for surgical intervention.  Her arteriogram is scheduled for 03/28/2018.  I will make further recommendations pending these results.  If she is not a candidate for endovascular intervention and needs to be considered for bypass she would obviously need vein map preoperatively.  She is on aspirin, Plavix, and a statin.   Deitra Mayo [facility] [phone]

## 2018-03-19 NOTE — Progress Notes (Signed)
Acosta name: Megan Acosta MRN: [id number] DOB: 1955-12-11 Sex: female   REASON FOR CONSULT:    Abnormal upper extremity arterial Doppler study.  Megan consult is requested by Dr. Bettina Gavia.  HPI:   Megan Acosta is a pleasant 62 y.o. female, who presents with weakness and numbness in her right arm.  On my history she describes Megan pain on Megan right side of her head, that radiates down her right neck, down to her arm, and then all Megan way down to her right second toe which feels like it is burning.  With respect to her right upper extremity she describes some paresthesias in Megan right upper extremity and states that Megan arm just does not work like it should.  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  Apparently she had been hospitalized in March with a possible stroke but underwent an extensive work-up and had no evidence of an acute stroke.  She did right-sided weakness at that time also.  At that time it also involved her leg however.   I do not get any history of claudication, rest pain, or nonhealing ulcers.  Megan Acosta does have a history of paroxysmal atrial fibrillation but to Megan best of her knowledge she has not had problems with A. fib for some time.  Her risk factors for peripheral vascular disease include a family history of premature cardiovascular disease and a smoking history.  She quit tobacco 2 years ago.  She denies any history of diabetes, hypertension, or hypercholesterolemia.  Past Medical History:  Diagnosis Date  . Alcohol dependence, uncomplicated ([place]) 68/10/2749  . Atrial fibrillation ([place]) 01/03/2018  . Benzodiazepine dependence ([place]) 07/12/2016  . Chest pain 01/03/2018  . COPD (chronic obstructive pulmonary disease) ([place]) 01/03/2018  . Depression 01/03/2018  . Dyspnea on exertion 01/03/2018  . Elevated LFTs 07/12/2016  . Hyponatremia 07/12/2016  . Ovarian cancer ([place]) 01/03/2018  . Palpitations  01/03/2018  . PVD (peripheral vascular disease) ([place]) 01/03/2018    Family History  Problem Relation Age of Onset  . Stroke Mother   . Aneurysm Father   . Stroke Father   . Diabetes Brother   . Heart attack Brother   . Diabetes Maternal Uncle   . Heart disease Sister   . Stroke Sister   . Stroke Sister   . Heart attack Brother   . Heart disease Brother   . Diabetes Brother   . Diabetes Brother   . Healthy Brother     SOCIAL HISTORY: Social History   Socioeconomic History  . Marital status: Unknown    Spouse name: Not on file  . Number of children: Not on file  . Years of education: Not on file  . Highest education level: Not on file  Occupational History  . Not on file  Social Needs  . Financial resource strain: Not on file  . Food insecurity:    Worry: Not on file    Inability: Not on file  . Transportation needs:    Medical: Not on file    Non-medical: Not on file  Tobacco Use  . Smoking status: Former Smoker    Packs/day: 0.50    Types: Cigarettes  . Smokeless tobacco: Never Used  Substance and Sexual Activity  . Alcohol use: Not Currently    Comment: Pt. reports hx. of alcoholism, last ETOH 07/12/16  . Drug use: Not Currently  . Sexual  activity: Not on file  Lifestyle  . Physical activity:    Days per week: Not on file    Minutes per session: Not on file  . Stress: Not on file  Relationships  . Social connections:    Talks on phone: Not on file    Gets together: Not on file    Attends religious service: Not on file    Active member of club or organization: Not on file    Attends meetings of clubs or organizations: Not on file    Relationship status: Not on file  . Intimate partner violence:    Fear of current or ex partner: Not on file    Emotionally abused: Not on file    Physically abused: Not on file    Forced sexual activity: Not on file  Other Topics Concern  . Not on file  Social History Narrative  . Not on file    Allergies  Allergen  Reactions  . Penicillins Hives    Acosta states she faints after taking medication  . Sulfa Antibiotics Hives    Current Outpatient Medications  Medication Sig Dispense Refill  . ALPRAZolam (XANAX) 1 MG tablet Take 1 mg by mouth every 8 (eight) hours as needed for anxiety.  2  . aspirin EC 81 MG tablet Take 81 mg by mouth daily.    Marland Kitchen MAGNESIUM OXIDE 400 PO Take 1 tablet by mouth daily.    . rosuvastatin (CRESTOR) 20 MG tablet Take 1 tablet (20 mg total) by mouth daily. 90 tablet 3  . clopidogrel (PLAVIX) 75 MG tablet TAKE 1 TABLET BY MOUTH ONCE (1) DAILY  0  . metoprolol tartrate (LOPRESSOR) 50 MG tablet Take 0.5 tablets by mouth 2 (two) times daily.  2  . omeprazole (PRILOSEC) 40 MG capsule TAKE 1 CAPSULE BY MOUTH ONCE (1) DAILY  2  . VENTOLIN HFA 108 (90 Base) MCG/ACT inhaler Inhale 2 puffs into Megan lungs every 6 (six) hours as needed for wheezing.  0   No current facility-administered medications for this visit.     REVIEW OF SYSTEMS:  [X]  denotes positive finding, [ ]  denotes negative finding Cardiac  Comments:  Chest pain or chest pressure:    Shortness of breath upon exertion: x   Short of breath when lying flat:    Irregular heart rhythm: x       Vascular    Pain in calf, thigh, or hip brought on by ambulation:    Pain in feet at night that wakes you up from your sleep:     Blood clot in your veins:    Leg swelling:         Pulmonary    Oxygen at home:    Productive cough:  x   Wheezing:  x       Neurologic    Sudden weakness in arms or legs:  x   Sudden numbness in arms or legs:  x   Sudden onset of difficulty speaking or slurred speech: x   Temporary loss of vision in one eye:     Problems with dizziness:         Gastrointestinal    Blood in stool:     Vomited blood:         Genitourinary    Burning when urinating:     Blood in urine:        Psychiatric    Major depression:         Hematologic  Bleeding problems:    Problems with blood clotting  too easily:        Skin    Rashes or ulcers:        Constitutional    Fever or chills:     PHYSICAL EXAM:   There were no vitals filed for this visit.  GENERAL: Megan Acosta is a well-nourished female, in no acute distress. Megan vital signs are documented above. CARDIAC: There is a regular rate and rhythm.  VASCULAR: I do not detect carotid bruits or supraclavicular bruits. I cannot palpate a right radial pulse. She has a palpable left brachial and radial pulse. She has palpable femoral pulses.  I cannot palpate popliteal or pedal pulses. On Megan right side she has a biphasic posterior tibial signal with Megan Doppler and a brisk dorsalis pedis signal.  On Megan left side she has brisk monophasic Doppler signals in Megan dorsalis pedis and posterior tibial positions. PULMONARY: There is good air exchange bilaterally without wheezing or rales. ABDOMEN: Soft and non-tender with normal pitched bowel sounds.  MUSCULOSKELETAL: There are no major deformities or cyanosis. NEUROLOGIC: No focal weakness or paresthesias are detected. SKIN: There are no ulcers or rashes noted. PSYCHIATRIC: Megan Acosta has a normal affect.  DATA:    CAROTID DUPLEX: I have reviewed Megan carotid duplex scan that was done at [facility] on 12/31/2017.  There was no significant carotid disease bilaterally.  Both vertebral arteries were patent with antegrade flow.  UPPER EXTREMITY DUPLEX: I have also reviewed Megan upper extremity arterial duplex that was done on 01/31/2018.  This showed triphasic waveforms in Megan right subclavian artery with a biphasic axillary signal and monophasic signals from Megan brachial level down.  There was a hemodynamically significant stenosis noted in Megan proximal right brachial artery.  Hello of Megan right  MEDICAL ISSUES:   ATHEROSCLEROSIS OF RIGHT BRACHIAL ARTERY: Duplex scan and physical exam suggest significant disease of her brachial artery on Megan right side which likely explains her symptoms  of weakness and numbness.  Her carotid duplex scan was completely normal.  Given that her symptoms are quite disabling I have recommended we proceed with an arteriogram.  I have reviewed with Megan Acosta Megan indications for arteriography. In addition, I have reviewed Megan potential complications of arteriography including but not limited to: Bleeding, arterial injury, arterial thrombosis, dye action, renal insufficiency, or other unpredictable medical problems. I have explained to Megan Acosta that if we find disease amenable to angioplasty we could potentially address this at Megan same time. I have discussed Megan potential complications of angioplasty and stenting, including but not limited to: Bleeding, arterial thrombosis, arterial injury, dissection, or Megan need for surgical intervention.  Her arteriogram is scheduled for 03/28/2018.  I will make further recommendations pending these results.  If she is not a candidate for endovascular intervention and needs to be considered for bypass she would obviously need vein map preoperatively.  She is on aspirin, Plavix, and a statin.   Deitra Mayo [facility] [phone]

## 2018-03-28 ENCOUNTER — Encounter (HOSPITAL_COMMUNITY)
Admission: RE | Disposition: A | Discharge status: Home / Self Care | Payer: Self-pay | Source: Home / Self Care | Attending: Vascular Surgery

## 2018-03-28 ENCOUNTER — Ambulatory Visit (HOSPITAL_COMMUNITY): Payer: BLUE CROSS/BLUE SHIELD

## 2018-03-28 ENCOUNTER — Inpatient Hospital Stay (HOSPITAL_COMMUNITY)
Admission: RE | Admit: 2018-03-28 | Discharge: 2018-04-02 | DRG: 253 | Disposition: A | Discharge status: Home / Self Care | Payer: BLUE CROSS/BLUE SHIELD | Attending: Vascular Surgery | Admitting: Vascular Surgery

## 2018-03-28 ENCOUNTER — Telehealth: Payer: Self-pay | Admitting: Vascular Surgery

## 2018-03-28 ENCOUNTER — Encounter (HOSPITAL_COMMUNITY): Payer: Self-pay | Admitting: Cardiology

## 2018-03-28 DIAGNOSIS — I708 Atherosclerosis of other arteries: Secondary | ICD-10-CM | POA: Diagnosis present

## 2018-03-28 DIAGNOSIS — E785 Hyperlipidemia, unspecified: Secondary | ICD-10-CM | POA: Diagnosis present

## 2018-03-28 DIAGNOSIS — I739 Peripheral vascular disease, unspecified: Secondary | ICD-10-CM

## 2018-03-28 DIAGNOSIS — I771 Stricture of artery: Secondary | ICD-10-CM | POA: Diagnosis present

## 2018-03-28 DIAGNOSIS — D649 Anemia, unspecified: Secondary | ICD-10-CM | POA: Diagnosis present

## 2018-03-28 DIAGNOSIS — R55 Syncope and collapse: Secondary | ICD-10-CM | POA: Diagnosis not present

## 2018-03-28 DIAGNOSIS — I25110 Atherosclerotic heart disease of native coronary artery with unstable angina pectoris: Secondary | ICD-10-CM | POA: Diagnosis present

## 2018-03-28 DIAGNOSIS — Z90710 Acquired absence of both cervix and uterus: Secondary | ICD-10-CM

## 2018-03-28 DIAGNOSIS — Z7982 Long term (current) use of aspirin: Secondary | ICD-10-CM | POA: Diagnosis not present

## 2018-03-28 DIAGNOSIS — Z8249 Family history of ischemic heart disease and other diseases of the circulatory system: Secondary | ICD-10-CM

## 2018-03-28 DIAGNOSIS — R9439 Abnormal result of other cardiovascular function study: Secondary | ICD-10-CM | POA: Diagnosis not present

## 2018-03-28 DIAGNOSIS — Z880 Allergy status to penicillin: Secondary | ICD-10-CM

## 2018-03-28 DIAGNOSIS — Z8543 Personal history of malignant neoplasm of ovary: Secondary | ICD-10-CM | POA: Diagnosis not present

## 2018-03-28 DIAGNOSIS — R000 Tachycardia, unspecified: Secondary | ICD-10-CM | POA: Diagnosis not present

## 2018-03-28 DIAGNOSIS — I200 Unstable angina: Secondary | ICD-10-CM

## 2018-03-28 DIAGNOSIS — L7632 Postprocedural hematoma of skin and subcutaneous tissue following other procedure: Secondary | ICD-10-CM | POA: Diagnosis not present

## 2018-03-28 DIAGNOSIS — Z882 Allergy status to sulfonamides status: Secondary | ICD-10-CM

## 2018-03-28 DIAGNOSIS — E876 Hypokalemia: Secondary | ICD-10-CM | POA: Diagnosis present

## 2018-03-28 DIAGNOSIS — I952 Hypotension due to drugs: Secondary | ICD-10-CM | POA: Diagnosis not present

## 2018-03-28 DIAGNOSIS — Z7901 Long term (current) use of anticoagulants: Secondary | ICD-10-CM | POA: Diagnosis not present

## 2018-03-28 DIAGNOSIS — T443X5A Adverse effect of other parasympatholytics [anticholinergics and antimuscarinics] and spasmolytics, initial encounter: Secondary | ICD-10-CM | POA: Diagnosis not present

## 2018-03-28 DIAGNOSIS — I480 Paroxysmal atrial fibrillation: Secondary | ICD-10-CM | POA: Diagnosis present

## 2018-03-28 DIAGNOSIS — R079 Chest pain, unspecified: Secondary | ICD-10-CM | POA: Diagnosis not present

## 2018-03-28 DIAGNOSIS — Z87891 Personal history of nicotine dependence: Secondary | ICD-10-CM

## 2018-03-28 DIAGNOSIS — Y92230 Patient room in hospital as the place of occurrence of the external cause: Secondary | ICD-10-CM | POA: Diagnosis not present

## 2018-03-28 DIAGNOSIS — J449 Chronic obstructive pulmonary disease, unspecified: Secondary | ICD-10-CM | POA: Diagnosis present

## 2018-03-28 DIAGNOSIS — Z79899 Other long term (current) drug therapy: Secondary | ICD-10-CM

## 2018-03-28 DIAGNOSIS — F419 Anxiety disorder, unspecified: Secondary | ICD-10-CM | POA: Diagnosis present

## 2018-03-28 HISTORY — DX: Peripheral vascular disease, unspecified: I73.9

## 2018-03-28 HISTORY — PX: UPPER EXTREMITY ANGIOGRAPHY: CATH118270

## 2018-03-28 HISTORY — PX: AORTIC ARCH ANGIOGRAPHY: CATH118224

## 2018-03-28 HISTORY — PX: PERIPHERAL VASCULAR BALLOON ANGIOPLASTY: CATH118281

## 2018-03-28 LAB — POCT I-STAT, CHEM 8
BUN: 14 mg/dL (ref 6–20)
CREATININE: 0.7 mg/dL (ref 0.44–1.00)
Calcium, Ion: 1.1 mmol/L — ABNORMAL LOW (ref 1.15–1.40)
Chloride: 103 mmol/L (ref 101–111)
GLUCOSE: 98 mg/dL (ref 65–99)
HEMATOCRIT: 35 % — AB (ref 36.0–46.0)
HEMOGLOBIN: 11.9 g/dL — AB (ref 12.0–15.0)
POTASSIUM: 3.8 mmol/L (ref 3.5–5.1)
Sodium: 141 mmol/L (ref 135–145)
TCO2: 27 mmol/L (ref 22–32)

## 2018-03-28 LAB — POCT ACTIVATED CLOTTING TIME
ACTIVATED CLOTTING TIME: 252 s
ACTIVATED CLOTTING TIME: 169 s
Activated Clotting Time: 224 seconds

## 2018-03-28 LAB — BASIC METABOLIC PANEL
ANION GAP: 6 (ref 5–15)
BUN: 10 mg/dL (ref 6–20)
CO2: 23 mmol/L (ref 22–32)
Calcium: 7.4 mg/dL — ABNORMAL LOW (ref 8.9–10.3)
Chloride: 112 mmol/L — ABNORMAL HIGH (ref 101–111)
Creatinine, Ser: 0.59 mg/dL (ref 0.44–1.00)
GFR calc Af Amer: >60 mL/min (ref >60)
GLUCOSE: 113 mg/dL — AB (ref 65–99)
Potassium: 3.1 mmol/L — ABNORMAL LOW (ref 3.5–5.1)
Sodium: 141 mmol/L (ref 135–145)

## 2018-03-28 LAB — TROPONIN I

## 2018-03-28 LAB — CBC
HCT: 30.3 % — ABNORMAL LOW (ref 36.0–46.0)
Hemoglobin: 9.8 g/dL — ABNORMAL LOW (ref 12.0–15.0)
MCH: 29.2 pg (ref 26.0–34.0)
MCHC: 32.3 g/dL (ref 30.0–36.0)
MCV: 90.2 fL (ref 78.0–100.0)
PLATELETS: 153 10*3/uL (ref 150–400)
RBC: 3.36 MIL/uL — ABNORMAL LOW (ref 3.87–5.11)
RDW: 13.2 % (ref 11.5–15.5)
WBC: 10.1 10*3/uL (ref 4.0–10.5)

## 2018-03-28 SURGERY — AORTIC ARCH ANGIOGRAPHY
Anesthesia: LOCAL | Laterality: Right

## 2018-03-28 MED ORDER — ASPIRIN EC 81 MG PO TBEC
81.0000 mg | DELAYED_RELEASE_TABLET | Freq: Every day | ORAL | Status: DC
  Administered 2018-03-29 – 2018-04-02 (×4): 81 mg via ORAL
  Filled 2018-03-28 (×4): qty 1

## 2018-03-28 MED ORDER — IOPAMIDOL (ISOVUE-370) INJECTION 76%
80.0000 mL | Freq: Once | INTRAVENOUS | Status: AC | PRN
  Administered 2018-03-28: 80 mL via INTRAVENOUS

## 2018-03-28 MED ORDER — SODIUM CHLORIDE 0.9 % IV SOLN
INTRAVENOUS | Status: DC
  Administered 2018-03-28: 07:00:00 via INTRAVENOUS

## 2018-03-28 MED ORDER — IODIXANOL 320 MG/ML IV SOLN
INTRAVENOUS | Status: DC | PRN
  Administered 2018-03-28: 150 mL via INTRA_ARTERIAL

## 2018-03-28 MED ORDER — LIDOCAINE HCL (PF) 1 % IJ SOLN
INTRAMUSCULAR | Status: DC | PRN
  Administered 2018-03-28: 25 mL

## 2018-03-28 MED ORDER — ALBUTEROL SULFATE (2.5 MG/3ML) 0.083% IN NEBU: 3.0000 mL | INHALATION_SOLUTION | Freq: Four times a day (QID) | RESPIRATORY_TRACT | Status: DC | PRN

## 2018-03-28 MED ORDER — ONDANSETRON HCL 4 MG/2ML IJ SOLN
4.0000 mg | Freq: Four times a day (QID) | INTRAMUSCULAR | Status: DC | PRN
  Administered 2018-03-28 – 2018-04-01 (×6): 4 mg via INTRAVENOUS
  Filled 2018-03-28 (×5): qty 2

## 2018-03-28 MED ORDER — ONDANSETRON HCL 4 MG/2ML IJ SOLN
INTRAMUSCULAR | Status: AC
  Filled 2018-03-28: qty 2

## 2018-03-28 MED ORDER — ATROPINE SULFATE 1 MG/10ML IJ SOSY
1.0000 mg | PREFILLED_SYRINGE | Freq: Once | INTRAMUSCULAR | Status: AC
  Administered 2018-03-28: 1 mg via INTRAVENOUS

## 2018-03-28 MED ORDER — HEPARIN (PORCINE) IN NACL 2-0.9 UNITS/ML
INTRAMUSCULAR | Status: AC | PRN
  Administered 2018-03-28: 1000 mL

## 2018-03-28 MED ORDER — LABETALOL HCL 5 MG/ML IV SOLN: 10.0000 mg | INTRAVENOUS | Status: DC | PRN

## 2018-03-28 MED ORDER — FENTANYL CITRATE (PF) 100 MCG/2ML IJ SOLN
INTRAMUSCULAR | Status: DC | PRN
  Administered 2018-03-28: 50 ug via INTRAVENOUS

## 2018-03-28 MED ORDER — SODIUM CHLORIDE 0.9 % WEIGHT BASED INFUSION: 1.0000 mL/kg/h | INTRAVENOUS | Status: AC

## 2018-03-28 MED ORDER — PANTOPRAZOLE SODIUM 40 MG PO TBEC
40.0000 mg | DELAYED_RELEASE_TABLET | Freq: Every day | ORAL | Status: DC
  Administered 2018-03-29 – 2018-04-01 (×3): 40 mg via ORAL
  Filled 2018-03-28 (×3): qty 1

## 2018-03-28 MED ORDER — BISACODYL 10 MG RE SUPP: 10.0000 mg | Freq: Every day | RECTAL | Status: DC | PRN

## 2018-03-28 MED ORDER — CLOPIDOGREL BISULFATE 75 MG PO TABS
75.0000 mg | ORAL_TABLET | Freq: Every day | ORAL | Status: DC
  Administered 2018-03-29 – 2018-04-02 (×5): 75 mg via ORAL
  Filled 2018-03-28 (×5): qty 1

## 2018-03-28 MED ORDER — FENTANYL CITRATE (PF) 100 MCG/2ML IJ SOLN
INTRAMUSCULAR | Status: AC
  Filled 2018-03-28: qty 2

## 2018-03-28 MED ORDER — ALPRAZOLAM 0.5 MG PO TABS
1.0000 mg | ORAL_TABLET | Freq: Three times a day (TID) | ORAL | Status: DC | PRN
  Administered 2018-03-29 – 2018-04-02 (×8): 1 mg via ORAL
  Filled 2018-03-28 (×8): qty 2

## 2018-03-28 MED ORDER — SODIUM CHLORIDE 0.9 % IV SOLN
250.0000 mL | INTRAVENOUS | Status: DC | PRN
  Administered 2018-04-01: 250 mL via INTRAVENOUS

## 2018-03-28 MED ORDER — MORPHINE SULFATE (PF) 2 MG/ML IV SOLN: 1.0000 mg | INTRAVENOUS | Status: DC | PRN

## 2018-03-28 MED ORDER — SODIUM CHLORIDE 0.9% FLUSH
3.0000 mL | Freq: Two times a day (BID) | INTRAVENOUS | Status: DC
  Administered 2018-03-30 – 2018-04-01 (×2): 3 mL via INTRAVENOUS

## 2018-03-28 MED ORDER — ACETAMINOPHEN 325 MG PO TABS
ORAL_TABLET | ORAL | Status: AC
  Filled 2018-03-28: qty 2

## 2018-03-28 MED ORDER — HEPARIN (PORCINE) IN NACL 1000-0.9 UT/500ML-% IV SOLN
INTRAVENOUS | Status: AC
  Filled 2018-03-28: qty 1000

## 2018-03-28 MED ORDER — MIDAZOLAM HCL 2 MG/2ML IJ SOLN
INTRAMUSCULAR | Status: DC | PRN
  Administered 2018-03-28: 1 mg via INTRAVENOUS

## 2018-03-28 MED ORDER — POTASSIUM CHLORIDE CRYS ER 20 MEQ PO TBCR
40.0000 meq | EXTENDED_RELEASE_TABLET | Freq: Once | ORAL | Status: AC
  Administered 2018-03-28: 40 meq via ORAL
  Filled 2018-03-28: qty 2

## 2018-03-28 MED ORDER — HEPARIN SODIUM (PORCINE) 1000 UNIT/ML IJ SOLN
INTRAMUSCULAR | Status: DC | PRN
  Administered 2018-03-28: 6000 [IU] via INTRAVENOUS

## 2018-03-28 MED ORDER — HEPARIN SODIUM (PORCINE) 1000 UNIT/ML IJ SOLN
INTRAMUSCULAR | Status: AC
  Filled 2018-03-28: qty 1

## 2018-03-28 MED ORDER — POTASSIUM CHLORIDE CRYS ER 20 MEQ PO TBCR
40.0000 meq | EXTENDED_RELEASE_TABLET | Freq: Once | ORAL | Status: AC
  Administered 2018-03-28: 40 meq via ORAL
  Filled 2018-03-28 (×2): qty 2

## 2018-03-28 MED ORDER — SODIUM CHLORIDE 0.9% FLUSH: 3.0000 mL | INTRAVENOUS | Status: DC | PRN

## 2018-03-28 MED ORDER — ACETAMINOPHEN 325 MG PO TABS
650.0000 mg | ORAL_TABLET | ORAL | Status: DC | PRN
  Administered 2018-03-28 – 2018-04-01 (×7): 650 mg via ORAL
  Filled 2018-03-28 (×6): qty 2

## 2018-03-28 MED ORDER — ROSUVASTATIN CALCIUM 10 MG PO TABS
20.0000 mg | ORAL_TABLET | Freq: Every day | ORAL | Status: DC
  Administered 2018-03-29 – 2018-04-01 (×3): 20 mg via ORAL
  Filled 2018-03-28 (×2): qty 1
  Filled 2018-03-28 (×2): qty 2
  Filled 2018-03-28 (×2): qty 1
  Filled 2018-03-28: qty 2

## 2018-03-28 MED ORDER — ATROPINE SULFATE 1 MG/10ML IJ SOSY
PREFILLED_SYRINGE | INTRAMUSCULAR | Status: AC
  Filled 2018-03-28: qty 10

## 2018-03-28 MED ORDER — MORPHINE SULFATE (PF) 10 MG/ML IV SOLN: 1.0000 mg | INTRAVENOUS | Status: DC | PRN

## 2018-03-28 MED ORDER — OXYCODONE-ACETAMINOPHEN 5-325 MG PO TABS
1.0000 | ORAL_TABLET | Freq: Four times a day (QID) | ORAL | Status: DC | PRN
  Administered 2018-03-28 – 2018-04-01 (×6): 1 via ORAL
  Filled 2018-03-28 (×6): qty 1

## 2018-03-28 MED ORDER — LIDOCAINE HCL (PF) 1 % IJ SOLN
INTRAMUSCULAR | Status: AC
  Filled 2018-03-28: qty 30

## 2018-03-28 MED ORDER — HYDRALAZINE HCL 20 MG/ML IJ SOLN: 5.0000 mg | INTRAMUSCULAR | Status: DC | PRN

## 2018-03-28 MED ORDER — VORTIOXETINE HBR 10 MG PO TABS
10.0000 mg | ORAL_TABLET | Freq: Every day | ORAL | Status: DC
  Administered 2018-03-29 – 2018-04-01 (×3): 10 mg via ORAL
  Filled 2018-03-28 (×5): qty 1

## 2018-03-28 MED ORDER — MIDAZOLAM HCL 2 MG/2ML IJ SOLN
INTRAMUSCULAR | Status: AC
  Filled 2018-03-28: qty 2

## 2018-03-28 SURGICAL SUPPLY — 25 items
BALLN LUTONIX DCB 4X40X130 (BALLOONS) ×3
BALLOON LUTONIX DCB 4X40X130 (BALLOONS) ×2 IMPLANT
CATH ANGIO 5F PIGTAIL 100CM (CATHETERS) ×3 IMPLANT
CATH HEADHUNTER H1 5F 100CM (CATHETERS) ×3 IMPLANT
CATH MUSTANG 3X20X135 (BALLOONS) ×3 IMPLANT
COVER PRB 48X5XTLSCP FOLD TPE (BAG) ×2 IMPLANT
COVER PROBE 5X48 (BAG) ×1
DEVICE CONTINUOUS FLUSH (MISCELLANEOUS) ×3 IMPLANT
GUIDEWIRE ANGLED .035X260CM (WIRE) ×3 IMPLANT
KIT ENCORE 26 ADVANTAGE (KITS) ×3 IMPLANT
KIT MICROPUNCTURE NIT STIFF (SHEATH) ×3 IMPLANT
KIT PV (KITS) ×3 IMPLANT
SHEATH PINNACLE 5F 10CM (SHEATH) ×3 IMPLANT
SHEATH PINNACLE 6F 10CM (SHEATH) ×3 IMPLANT
SHEATH SHUTTLE SELECT 6F (SHEATH) ×3 IMPLANT
STOPCOCK MORSE 400PSI 3WAY (MISCELLANEOUS) ×3 IMPLANT
SYRINGE MEDRAD AVANTA MACH 7 (SYRINGE) ×3 IMPLANT
TAPE VIPERTRACK RADIOPAQ (MISCELLANEOUS) ×2 IMPLANT
TAPE VIPERTRACK RADIOPAQUE (MISCELLANEOUS) ×1
TRANSDUCER W/STOPCOCK (MISCELLANEOUS) ×3 IMPLANT
TRAY PV CATH (CUSTOM PROCEDURE TRAY) ×3 IMPLANT
TUBING CIL FLEX 10 FLL-RA (TUBING) ×3 IMPLANT
WIRE HITORQ VERSACORE ST 145CM (WIRE) ×3 IMPLANT
WIRE SPARTACORE .014X300CM (WIRE) ×3 IMPLANT
WIRE VERSACORE LOC 115CM (WIRE) ×3 IMPLANT

## 2018-03-28 NOTE — Consult Note (Addendum)
Cardiology Consultation:   Patient ID: Megan Acosta; [id number]; 1956-06-29   Admit date: 03/28/2018 Date of Consult: 03/28/2018  Primary Care Provider: Imagene Riches, NP Primary Cardiologist: Shirlee More, MD  Primary Electrophysiologist:  NA   Patient Profile:   Megan Acosta is a 62 y.o. female with a hx of PAF (20 yrs ago), and maintaining SR on last visit,  Hx of possible CVA, Rt sisded weakness- but now thought to be , + PAD and  who is being seen today for the evaluation of tachycardia and chest pain post PV procedure at the request of Dr. Scot Dock.  History of Present Illness:   Megan Acosta with hx PAF (20 yrs ago), possible CVA, and PAD had procedure today for weakness and numbness in her Rt upper ext, and duplex with RUE occlusion.  She had arteriogram and drug coated balloon angioplasty of Rt axillary artery for 95% stenosis.  Did well with procedure and when in recovery she had hematoma at Rt groin, her BP was down to 80s then to 40 systolic and HR to 76B, she was given Atropine 1 mg and HR to 151, ST and she then developed chest pain.     Once her HR slowed her chest pain went away.  With the rapid HR she was dizzy and felt terrible.   EKG SR with non specific ST abnormality similar to EKG of 12/2017.    Na on the 21st 141, K+ 3.8, Cr 0.70  hgb 11.9  Prior echo 12/31/17 with EF 60-65%, borderline concentric LVH, mild MR and TR.     Currently She tells me she never had stress test or event monitor due to the Rt axillary blockage was cause of symptoms.  She has had no outpt tachycardia.  No outpt chest pain.     Past Medical History:  Diagnosis Date  . Alcohol dependence, uncomplicated ([place]) 34/10/9377  . Atrial fibrillation ([place]) 01/03/2018  . Benzodiazepine dependence ([place]) 07/12/2016  . Chest pain 01/03/2018  . COPD (chronic obstructive pulmonary disease) ([place]) 01/03/2018  . Depression 01/03/2018  . Dyspnea on exertion 01/03/2018  . Elevated LFTs 07/12/2016  . Hyponatremia  07/12/2016  . Ovarian cancer ([place]) 01/03/2018  . Palpitations 01/03/2018  . PVD (peripheral vascular disease) ([place]) 01/03/2018    Past Surgical History:  Procedure Laterality Date  . ABDOMINAL HYSTERECTOMY    . APPENDECTOMY    . CESAREAN SECTION    . CYSTECTOMY    . ORTHOPEDIC SURGERY       Home Medications:  Prior to Admission medications   Medication Sig Start Date End Date Taking? Authorizing Provider  ALPRAZolam Duanne Moron) 1 MG tablet Take 1 mg by mouth every 8 (eight) hours as needed for anxiety. 12/16/17  Yes [provider]  aspirin EC 81 MG tablet Take 81 mg by mouth daily.   Yes [provider]  clopidogrel (PLAVIX) 75 MG tablet TAKE 1 TABLET BY MOUTH ONCE (1) DAILY 01/02/18  Yes [provider]  MAGNESIUM OXIDE 400 PO Take 1 tablet by mouth daily.   Yes [provider]  metoprolol tartrate (LOPRESSOR) 50 MG tablet Take 0.5 tablets by mouth at bedtime.  11/18/17  Yes [provider]  Multiple Vitamins-Minerals (MULTIVITAMIN WITH MINERALS) tablet Take 1 tablet by mouth daily.   Yes [provider]  Omega-3 Fatty Acids (FISH OIL) 1000 MG CAPS Take 1 capsule by mouth 2 (two) times daily.   Yes [provider]  omeprazole (PRILOSEC) 40 MG capsule TAKE 1  CAPSULE BY MOUTH ONCE (1) DAILY 01/08/18  Yes [provider]  rosuvastatin (CRESTOR) 20 MG tablet Take 1 tablet (20 mg total) by mouth daily. 01/28/18 04/28/18 Yes Richardo Priest, MD  TRINTELLIX 10 MG TABS tablet Take 1 tablet by mouth daily. 03/15/18  Yes [provider]  VENTOLIN HFA 108 (90 Base) MCG/ACT inhaler Inhale 2 puffs into the lungs every 6 (six) hours as needed for wheezing. 11/18/17  Yes [provider]    Inpatient Medications: Scheduled Meds:  Continuous Infusions: . sodium chloride 100 mL/hr at 03/28/18 0738  . sodium chloride     PRN Meds: acetaminophen, ondansetron (ZOFRAN) IV  Allergies:    Allergies  Allergen Reactions  .  Penicillins Hives    Patient states she faints after taking medication  Has patient had a PCN reaction causing immediate rash, facial/tongue/throat swelling, SOB or lightheadedness with hypotension: yes Has patient had a PCN reaction causing severe rash involving mucus membranes or skin necrosis: no Has patient had a PCN reaction that required hospitalization:no Has patient had a PCN reaction occurring within the last 10 years: no If all of the above answers are "NO", then may proceed with Cephalosporin use.   . Sulfa Antibiotics Hives    Social History:   Social History   Socioeconomic History  . Marital status: Unknown    Spouse name: Not on file  . Number of children: Not on file  . Years of education: Not on file  . Highest education level: Not on file  Occupational History  . Not on file  Social Needs  . Financial resource strain: Not on file  . Food insecurity:    Worry: Not on file    Inability: Not on file  . Transportation needs:    Medical: Not on file    Non-medical: Not on file  Tobacco Use  . Smoking status: Former Smoker    Packs/day: 0.50    Types: Cigarettes  . Smokeless tobacco: Never Used  Substance and Sexual Activity  . Alcohol use: Not Currently    Comment: Pt. reports hx. of alcoholism, last ETOH 07/12/16  . Drug use: Not Currently  . Sexual activity: Not on file  Lifestyle  . Physical activity:    Days per week: Not on file    Minutes per session: Not on file  . Stress: Not on file  Relationships  . Social connections:    Talks on phone: Not on file    Gets together: Not on file    Attends religious service: Not on file    Active member of club or organization: Not on file    Attends meetings of clubs or organizations: Not on file    Relationship status: Not on file  . Intimate partner violence:    Fear of current or ex partner: Not on file    Emotionally abused: Not on file    Physically abused: Not on file    Forced sexual activity: Not  on file  Other Topics Concern  . Not on file  Social History Narrative  . Not on file    Family History:    Family History  Problem Relation Age of Onset  . Stroke Mother   . Aneurysm Father   . Stroke Father   . Diabetes Brother   . Heart attack Brother   . Diabetes Maternal Uncle   . Heart disease Sister        cabg  . Stroke Sister   .  Stroke Sister   . Heart attack Brother   . Heart disease Brother        cabg  . Diabetes Brother   . Diabetes Brother   . Healthy Brother      ROS:  Please see the history of present illness.  General:no colds or fevers, no weight changes Skin:no rashes or ulcers HEENT:no blurred vision, no congestion CV:see HPI PUL:see HPI GI:no diarrhea constipation or melena, no indigestion GU:no hematuria, no dysuria MS:no joint pain, no claudication Neuro:no syncope, no lightheadedness Endo:no diabetes, no thyroid disease  All other ROS reviewed and negative.     Physical Exam/Data:   Vitals:   03/28/18 0837 03/28/18 0842 03/28/18 0847 03/28/18 0900  BP: 125/66 121/67 100/64 121/72  Pulse: 63 67 70 60  Resp: 12 17 12 19   Temp:      SpO2: 97% 98% 98% 100%  Weight:      Height:       No intake or output data in the 24 hours ending 03/28/18 1143 Filed Weights   03/28/18 0543  Weight: 141 lb (64 kg)   Body mass index is 26.21 kg/m.  General:  Well nourished, well developed, in no acute distress HEENT: normal Lymph: no adenopathy Neck: no JVD Endocrine:  No thryomegaly Vascular: No carotid bruits; pedal pulses 2+ bilaterally Cardiac:  normal S1, S2; RRR; no murmur, gallup rub or click   Lungs:  clear to auscultation bilaterally, no wheezing, rhonchi or rales  Abd: soft, nontender, no hepatomegaly  Ext: no edema Musculoskeletal:  No deformities, BUE and BLE strength normal and equal Skin: warm and dry  Neuro:  Alert and oriented X 3 MAE follows commands, no focal abnormalities noted--tongue midline, + facial symmetry with smile  and frown.  Psych:  Normal affect    Relevant CV Studies: See above  Laboratory Data:  Chemistry Recent Labs  Lab 03/28/18 0639  NA 141  K 3.8  CL 103  GLUCOSE 98  BUN 14  CREATININE 0.70    No results for input(s): PROT, ALBUMIN, AST, ALT, ALKPHOS, BILITOT in the last 168 hours. Hematology Recent Labs  Lab 03/28/18 0639  HGB 11.9*  HCT 35.0*   Cardiac EnzymesNo results for input(s): TROPONINI in the last 168 hours. No results for input(s): TROPIPOC in the last 168 hours.  BNPNo results for input(s): BNP, PROBNP in the last 168 hours.  DDimer No results for input(s): DDIMER in the last 168 hours.  Radiology/Studies:  No results found.  Assessment and Plan:   1. Chest pain after vagal episode and atropine IV, leading to ST and chest pain along with dizziness.  Once HR slowed she had no further pain.  Currently pain free.  Will check troponin with her strong FH of CAD.  No prior stress test or cath. Dr. Ellyn Hack to see. Will add stress test tomorrow with her [place] and it had been previously ordered but then cancelled.  2. PAD with Rt axillary artery stenosis and PTA with stent today.  Did well with procedure.   3. Hypotension receiving IV fluids. At 200 an hour, will check CBC and BMP  4. Hx of PAF in her 68s,  5. Recent rt arm weakness and numbness- she was evaluated for CVA but eventually found to be Rt axillary artery stenosis.  Due to this her stress test and holter monitor was stopped.    For questions or updates, please contact [place] Please consult [email or web address] for contact info under Cardiology/STEMI.  Signed, Cecilie Kicks, NP  03/28/2018 11:43 AM

## 2018-03-28 NOTE — Progress Notes (Signed)
VASCULAR SURGERY:  The patient had some continued hypotension and therefore I did obtain a CT of the abdomen and pelvis.  This does not show any significant large retroperitoneal hematoma.  There is a small amount of hematoma in the right lower quadrant.  Hemoglobin is 9.8.  Currently blood pressure is 113/75.  Her exam is unremarkable.  Abdomen is soft and nontender.  Patient will be admitted overnight for observation.  Cardiology has seen the patient and plans a Myoview prior to discharge.  Deitra Mayo, MD, [place] [phone] Office: [phone]

## 2018-03-28 NOTE — Op Note (Signed)
PATIENT: Megan Acosta      MRN: [id number] DOB: December 15, 1955    DATE OF PROCEDURE: 03/28/2018  INDICATIONS:    Megan Acosta is a 62 y.o. female who presented with weakness and numbness in her right upper extremity.  Based on her duplex she had evidence of right upper extremity arterial occlusive disease.  She presents for arteriography.  PROCEDURE:    1.  Ultrasound-guided access to the right common femoral artery 2.  Conscious sedation 3.  Arch aortogram 4.  Selective catheterization of the right subclavian artery (second order catheterization) 5. Right upper extremity arteriogram 6.  Drug-coated balloon angioplasty of right axillary artery (4 mm x 40 mm Lutonix drug-coated balloon)  SURGEON: Judeth Cornfield. Scot Dock, MD, FACS  ANESTHESIA: Local with sedation artery  EBL: Minimal   TECHNIQUE: The patient was brought to the peripheral vascular lab and was sedated. The period of conscious sedation was 69 minutes.  During that time period, I was present face-to-face 100% of the time.  The patient was administered 1 mg of Versed and 50 mcg of fentanyl. The patient's heart rate, blood pressure, and oxygen saturation were monitored by the nurse continuously during the procedure.  Both groins were prepped and draped in the usual sterile fashion.  Under ultrasound guidance, after the skin was anesthetized, I cannulated the right common femoral artery with a micropuncture needle and a micropuncture sheath was introduced over a wire.  This was exchanged for a 5 [place] sheath over a Bentson wire.  By ultrasound the femoral artery was patent.  There was no significant plaque noted in the right common femoral artery.  A real-time image was obtained and placed in the chart.   Next a long pigtail catheter was advanced over a wire into the ascending aortic arch and arch aortogram obtained.  This was done at a 40 degree LAO projection.  I then used an H1 catheter to selectively cannulate the innominate  artery and the guidewire was advanced into the right subclavian artery.  The H1 catheter was advanced over the wire.  Selective right subclavian arteriogram was obtained and the entire right upper extremity was evaluated.  The only finding of significance was a tight 95% stenosis of the right axillary artery.  I elected to address this with balloon angioplasty.  The 5 French sheath was exchanged for a long 6 [place] sheath and the patient was heparinized.  ACT was monitored throughout the case.  Measurements were obtained of the stenosis and I elected to predilate the lesion with a 3 mm x 2 cm balloon.  This was positioned across the stenosis and inflated to 24 atm for 1 minute.  Completion films showed improvement in the stenosis but some residual stenosis.  I went back with a 4 mm x 4 cm drug-coated balloon.  This was inflated slowly to 8 atm and then this was maintained for 3 minutes.  Completion films showed an excellent result with no residual stenosis.  I then obtained additional films distally to be sure there was no evidence of embolization and there was not.  The 6 French sheath was retracted into the distal aorta and then the long sheath was exchanged for a short 6 French sheath over the wire.  The patient was transferred to the holding area for removal of the sheath.  No immediate complications were noted.  FINDINGS:   1.  Arch aortogram shows no significant disease in the arch.  The right subclavian, innominate, right common carotid artery, left  common carotid artery and left clavian arteries are all widely patent. 2 There was a 95% stenosis in the right axillary artery which was successfully ballooned as described above with no residual stenosis. 3.  Below the area of concern the brachial artery, radial, and ulnar arteries are widely patent.  There is poor visualization in the hand but no evidence of embolic disease.  PRE: 95% POST: No residual stenosis STENT: None  Deitra Mayo,  MD, FACS [facility]  DATE OF DICTATION:   03/28/2018

## 2018-03-28 NOTE — Progress Notes (Addendum)
Site area: Right groin a 6 french arterial sheath was removed  Site Prior to Removal:  Level 1- Hematoma  Pressure Applied For 45 MINUTES    Bedrest Beginning at 1120am  Manual:   Yes.    Patient Status During Pull:  stable  Post Pull Groin Site:  Level 0  Post Pull Instructions Given:  Yes.    Post Pull Pulses Present:  Yes.    Dressing Applied:  Yes.    Comments:  Pt had a level hematoma  Before sheath pull.  Pt became very hypotensive.  Atropine 1 mg given IVF bolus, Zofran 4 mg given .  BP retrun to normal and HR increased to 140.  Dr Scot Dock in to see pt and EKG done.  Pt resently VS WNL and right groin is a level 0 at this time.

## 2018-03-28 NOTE — Progress Notes (Signed)
CT scan complete and Dr Scot Dock in to see  Patient.  Report called to [place]

## 2018-03-28 NOTE — Progress Notes (Signed)
K+ is 3.1 I am replacing,  Recheck in AM.  Troponin neg.

## 2018-03-28 NOTE — Progress Notes (Signed)
VASCULAR SURGERY:  The patient became bradycardic and diaphoretic.  She received atropine and her heart rate responded nicely however she became hypotensive.  On my history she said she was having some chest pain although this may have been related to the tachycardia.  She denied any abdominal pain or back pain.  EKG showed some mild ST changes and I have consulted cardiology.  We will admit her overnight for observation.  Currently the groin is soft without evidence of hematoma.  Her abdomen is soft and nontender.  Her blood pressure responded to fluid boluses.  Deitra Mayo, MD, [place] [phone] Office: [phone]

## 2018-03-28 NOTE — Interval H&P Note (Signed)
History and Physical Interval Note:  03/28/2018 7:23 AM  Megan Acosta  has presented today for surgery, with the diagnosis of right brachial artery stenosis, right arm numbness  The various methods of treatment have been discussed with the patient and family. After consideration of risks, benefits and other options for treatment, the patient has consented to  Procedure(s): AORTIC ARCH ANGIOGRAPHY (N/A) UPPER EXTREMITY ANGIOGRAPHY (N/A) as a surgical intervention .  The patient's history has been reviewed, patient examined, no change in status, stable for surgery.  I have reviewed the patient's chart and labs.  Questions were answered to the patient's satisfaction.     Deitra Mayo

## 2018-03-28 NOTE — Telephone Encounter (Signed)
sch appt lvm 04/23/18 3pm f/u MD

## 2018-03-29 ENCOUNTER — Inpatient Hospital Stay (HOSPITAL_COMMUNITY): Payer: BLUE CROSS/BLUE SHIELD

## 2018-03-29 DIAGNOSIS — R55 Syncope and collapse: Secondary | ICD-10-CM

## 2018-03-29 DIAGNOSIS — R079 Chest pain, unspecified: Secondary | ICD-10-CM

## 2018-03-29 LAB — CBC
HCT: 27.7 % — ABNORMAL LOW (ref 36.0–46.0)
HEMOGLOBIN: 8.9 g/dL — AB (ref 12.0–15.0)
MCH: 28.7 pg (ref 26.0–34.0)
MCHC: 32.1 g/dL (ref 30.0–36.0)
MCV: 89.4 fL (ref 78.0–100.0)
Platelets: 161 10*3/uL (ref 150–400)
RBC: 3.1 MIL/uL — ABNORMAL LOW (ref 3.87–5.11)
RDW: 13.4 % (ref 11.5–15.5)
WBC: 7 10*3/uL (ref 4.0–10.5)

## 2018-03-29 LAB — BASIC METABOLIC PANEL
Anion gap: 6 (ref 5–15)
BUN: 8 mg/dL (ref 6–20)
CHLORIDE: 109 mmol/L (ref 101–111)
CO2: 27 mmol/L (ref 22–32)
CREATININE: 0.62 mg/dL (ref 0.44–1.00)
Calcium: 8.3 mg/dL — ABNORMAL LOW (ref 8.9–10.3)
GFR calc Af Amer: >60 mL/min (ref >60)
GFR calc non Af Amer: >60 mL/min (ref >60)
GLUCOSE: 85 mg/dL (ref 65–99)
Potassium: 4.1 mmol/L (ref 3.5–5.1)
SODIUM: 142 mmol/L (ref 135–145)

## 2018-03-29 LAB — NM MYOCAR MULTI W/SPECT W/WALL MOTION / EF
CSEPED: 5 min
CSEPHR: 78 %
Estimated workload: 1 METS
MPHR: 160 {beats}/min
Peak HR: 126 {beats}/min
Rest HR: 86 {beats}/min

## 2018-03-29 LAB — TROPONIN I

## 2018-03-29 MED ORDER — TECHNETIUM TC 99M TETROFOSMIN IV KIT
10.0000 | PACK | Freq: Once | INTRAVENOUS | Status: AC | PRN
  Administered 2018-03-29: 10 via INTRAVENOUS

## 2018-03-29 MED ORDER — REGADENOSON 0.4 MG/5ML IV SOLN
0.4000 mg | Freq: Once | INTRAVENOUS | Status: DC
  Filled 2018-03-29: qty 5

## 2018-03-29 MED ORDER — BISACODYL 5 MG PO TBEC
10.0000 mg | DELAYED_RELEASE_TABLET | Freq: Every day | ORAL | Status: DC | PRN
  Administered 2018-03-30 – 2018-04-01 (×2): 10 mg via ORAL
  Filled 2018-03-29 (×2): qty 2

## 2018-03-29 MED ORDER — TECHNETIUM TC 99M TETROFOSMIN IV KIT
30.0000 | PACK | Freq: Once | INTRAVENOUS | Status: AC | PRN
  Administered 2018-03-29: 30 via INTRAVENOUS

## 2018-03-29 MED ORDER — REGADENOSON 0.4 MG/5ML IV SOLN
INTRAVENOUS | Status: AC
  Administered 2018-03-29: 0.4 mg
  Filled 2018-03-29: qty 5

## 2018-03-29 NOTE — Progress Notes (Addendum)
Progress Note  Patient Name: Megan Acosta Date of Encounter: 03/29/2018  Primary Cardiologist: Shirlee More, MD   Subjective   No complaints other than nausea during chemical stress test.  Inpatient Medications    Scheduled Meds: . aspirin EC  81 mg Oral Daily  . clopidogrel  75 mg Oral Daily  . pantoprazole  40 mg Oral Daily  . regadenoson      . regadenoson  0.4 mg Intravenous Once  . rosuvastatin  20 mg Oral Daily  . sodium chloride flush  3 mL Intravenous Q12H  . vortioxetine HBr  10 mg Oral Daily   Continuous Infusions: . sodium chloride     PRN Meds: sodium chloride, acetaminophen, albuterol, ALPRAZolam, bisacodyl, hydrALAZINE, labetalol, morphine injection, ondansetron (ZOFRAN) IV, oxyCODONE-acetaminophen, sodium chloride flush   Vital Signs    Vitals:   03/29/18 0107 03/29/18 0400 03/29/18 0800 03/29/18 0930  BP: (!) 83/50 (!) 95/53 100/63 99/74  Pulse: 85 77 90 87  Resp: (!) 21 12 20    Temp: 98.5 F (36.9 C) 98.6 F (37 C)    TempSrc: Oral     SpO2: 96% 96% 96%   Weight:  151 lb 10.8 oz (68.8 kg)    Height:        Intake/Output Summary (Last 24 hours) at 03/29/2018 0936 Last data filed at 03/29/2018 0451 Gross per 24 hour  Intake -  Output 1700 ml  Net -1700 ml   Filed Weights   03/28/18 0543 03/29/18 0400  Weight: 141 lb (64 kg) 151 lb 10.8 oz (68.8 kg)    Telemetry    NSR prior to stress test, sinus tach max HR 125 during stress portion nuclear study, occasional PVCs - Personally Reviewed  ECG    NSR 80s, resting EKG prior to stress test - Personally Reviewed  Physical Exam   GEN: No acute distress.   Neck: No JVD Cardiac: RRR, no murmurs, rubs, or gallops.  Respiratory: Clear to auscultation bilaterally. GI: Soft, nontender, non-distended  MS: No edema; No deformity. Neuro:  Nonfocal  Psych: Normal affect   Labs    Chemistry Recent Labs  Lab 03/28/18 0639 03/28/18 1157 03/29/18 0335  NA 141 141 142  K 3.8 3.1* 4.1  CL  103 112* 109  CO2  --  23 27  GLUCOSE 98 113* 85  BUN 14 10 8   CREATININE 0.70 0.59 0.62  CALCIUM  --  7.4* 8.3*  GFRNONAA  --  >60 >60  GFRAA  --  >60 >60  ANIONGAP  --  6 6     Hematology Recent Labs  Lab 03/28/18 0639 03/28/18 1157 03/29/18 0335  WBC  --  10.1 7.0  RBC  --  3.36* 3.10*  HGB 11.9* 9.8* 8.9*  HCT 35.0* 30.3* 27.7*  MCV  --  90.2 89.4  MCH  --  29.2 28.7  MCHC  --  32.3 32.1  RDW  --  13.2 13.4  PLT  --  153 161    Cardiac Enzymes Recent Labs  Lab 03/28/18 1157 03/28/18 2017 03/29/18 0335  TROPONINI <0.03 <0.03 <0.03   No results for input(s): TROPIPOC in the last 168 hours.   BNPNo results for input(s): BNP, PROBNP in the last 168 hours.   DDimer No results for input(s): DDIMER in the last 168 hours.   Radiology    Ct Abdomen Pelvis W Contrast  Result Date: 03/28/2018 CLINICAL DATA:  62 year old female status post arteriogram this morning. Right groin site  hematoma. Abdominal pain, diaphoresis. EXAM: CT ABDOMEN AND PELVIS WITH CONTRAST TECHNIQUE: Multidetector CT imaging of the abdomen and pelvis was performed using the standard protocol following bolus administration of intravenous contrast. CONTRAST:  70mL ISOVUE-370 IOPAMIDOL (ISOVUE-370) INJECTION 76% COMPARISON:  None. FINDINGS: Lower chest: Negative lung bases. No pericardial or pleural effusion. Hepatobiliary: Negative liver. Vicarious contrast excretion into the gallbladder. No upper abdominal free fluid. Pancreas: Negative. Spleen: Negative. Adrenals/Urinary Tract: Normal adrenal glands. Both kidneys are enhancing and also excreting IV contrast to the renal collecting systems and ureters. Mild mass effect along the course of the distal right ureter related to the pelvic hematoma described below, but no hydroureter or hydronephrosis. Excreted IV contrast throughout the urinary bladder. Mild mass effect on the bladder from adjacent hematoma described below. Stomach/Bowel: Mild mass effect on the  rectum and sigmoid colon from right pelvic sidewall hematoma described below. Retained stool in the distal colon. Similar retained stool throughout the descending colon. Redundant transverse colon. Negative right colon aside from small volume adjacent hematoma in the right pericolic gutter described below. Negative terminal ileum. Appendix is diminutive or absent. No dilated small bowel. Negative stomach and duodenum; small duodenum diverticulum. No abdominal free air. Vascular/Lymphatic: Aortoiliac calcified atherosclerosis. The major arterial structures in the abdomen and pelvis are patent. The right external iliac artery, common femoral artery, and visible right SFA and PFA are patent. These have a similar caliber to the contralateral left external iliac and femoral arteries. Portal venous system is patent. The central abdominal and pelvic veins appear fairly normal in caliber, without suggestion of hypovolemia. No lymphadenopathy. Reproductive: Surgically absent. Multiple bilateral pelvic sidewall surgical clips. Other: Soft tissue stranding throughout the right inguinal region tracking into the proximal anterior right thigh compatible with sequelae of hematoma. Blood products tracking into the pelvis along the right pelvic side wall and into the bilateral space of Retzius greater on the right (series 3, image 71). Trace presacral involvement by the hematoma. Cephalad extension of blood products into the right retroperitoneum and right pericolic gutter (series 3, image 49). See also coronal images 67 and 69. Musculoskeletal: Negative. IMPRESSION: 1. Right inguinal hematoma with extension into the right hemipelvis, bilateral space of Retzius, and right pericolic gutter. The degree of hematoma extension is best depicted on coronal images 67 and 69. 2. Mild associated regional mass effect in the right pelvis and right lower quadrant. 3. The major arterial structures in the abdomen, pelvis, and proximal lower  extremities remain patent - including the right external iliac and visible femoral arteries. Electronically Signed   By: Genevie Ann M.D.   On: 03/28/2018 15:53    Cardiac Studies   2D Echo 12/2017 (scanned report) EF normal at 60-65%, impaired relaxation.   Patient Profile     Megan Acosta is a 62 y.o. female with a hx of PAF (20 yrs ago), and maintaining SR on last visit,  Hx of possible CVA w/ subsequent Rt sisded weakness abd PAD and  who is being seen today for the evaluation of tachycardia and chest pain post PV procedure (arteriography) at the request of Dr. Scot Dock.   Assessment & Plan    1. Chest Pain: in the setting of sinus tach, with rate of 155 bpm, following a vascular procedure (ateriography). Cardiac enzymes are negative x 3. NST completed. Results pending.   2. Sinus Tach:  Resulted from atropine given for vagal response to sheath removal. NSR w/ HR in the 80s prior to stress test. Sinus tach during  stress portion with max HR at 125 (CP free).   3. Anemia: drop in Hgb from 9.8>>8.9 following arteriography. Vascular surgery to follow.   For questions or updates, please contact [place] Please consult [email or web address] for contact info under Cardiology/STEMI.      Signed, Lyda Jester, PA-C  03/29/2018, 9:36 AM    Personally seen and examined. Agree with above.  Currently in bed.  Main complaint is headache back right side of her head.  Otherwise, no significant chest pain.  No shortness of breath.  GEN: Well nourished, well developed, in no acute distress  HEENT: normal  Neck: no JVD, carotid bruits, or masses Cardiac: RRR; no murmurs, rubs, or gallops,no edema  Respiratory:  clear to auscultation bilaterally, normal work of breathing GI: soft, nontender, nondistended, + BS MS: no deformity or atrophy  Skin: warm and dry, no rash Neuro:  Alert and Oriented x 3, Strength and sensation are intact Psych: Mildly depressed mood, full affect  PV procedure-Dr.  Scot Dock  -Drug-coated balloon angioplasty of right axillary artery.      Assessment and plan  Peripheral arterial disease -Status post balloon angioplasty of right axillary artery.Plavix aspirin statin.  Vasovagal reaction during sheath removal - Atropine utilized, increased heart rate.  Currently stable.  Hypokalemia resolved, troponin is negative.  Hemoglobin 8.9, mildly reduced.  No evidence of retroperitoneal bleed on CT scan.  Awaiting results of nuclear stress test.  Hopeful discharge today.  Candee Furbish, MD

## 2018-03-29 NOTE — Progress Notes (Signed)
     Nuclear stress test was abnormal   Downsloping ST segment depression ST segment depression was noted during stress in the II, III, aVF, V5 and V6 leads, and returning to baseline after less than 1 minute of recovery.  Defect 1: There is a small defect of severe severity present in the basal inferoseptal location consistent with prior infarct.  Defect 2: There is a medium defect of moderate severity present in the mid inferoseptal and mid inferior location consistent with ischemia.  Nuclear stress EF: 76%. There is basal inferoseptal wall akinesis  Findings consistent with ischemia and prior myocardial infarction.  This is an intermediate risk study.   Recommend cardiac catheterization, left radial artery approach (just had right axillary artery ballooned) on Monday.  Risks and benefits including stroke heart attack death renal impairment discussed.  She is willing to proceed.  Candee Furbish, MD

## 2018-03-29 NOTE — Discharge Instructions (Signed)
° °  [facility] ° °Discharge Instructions ° °Lower Extremity Angiogram; Angioplasty/Stenting ° °Please refer to the following instructions for your post-procedure care. Your surgeon or physician assistant will discuss any changes with you. ° °Activity ° °Avoid lifting more than 8 pounds (1 gallons of milk) for 72 hours (3 days) after your procedure. You may walk as much as you can tolerate. It's OK to drive after 72 hours. ° °Bathing/Showering ° °You may shower the day after your procedure. If you have a bandage, you may remove it at 24- 48 hours. Clean your incision site with mild soap and water. Pat the area dry with a clean towel. ° °Diet ° °Resume your pre-procedure diet. There are no special food restrictions following this procedure. All patients with peripheral vascular disease should follow a low fat/low cholesterol diet. In order to heal from your surgery, it is CRITICAL to get adequate nutrition. Your body requires vitamins, minerals, and protein. Vegetables are the best source of vitamins and minerals. Vegetables also provide the perfect balance of protein. Processed food has little nutritional value, so try to avoid this. ° °Medications ° °Resume taking all of your medications unless your doctor tells you not to. If your incision is causing pain, you may take over-the-counter pain relievers such as acetaminophen (Tylenol) ° °Follow Up ° °Follow up will be arranged at the time of your procedure. You may have an office visit scheduled or may be scheduled for surgery. Ask your surgeon if you have any questions. ° °Please call us immediately for any of the following conditions: °•Severe or worsening pain your legs or feet at rest or with walking. °•Increased pain, redness, drainage at your groin puncture site. °•Fever of 101 degrees or higher. °•If you have any mild or slow bleeding from your puncture site: lie down, apply firm constant pressure over the area with a piece of  gauze or a clean wash cloth for 30 minutes- no peeking!, call 911 right away if you are still bleeding after 30 minutes, or if the bleeding is heavy and unmanageable. ° °Reduce your risk factors of vascular disease: ° °Stop smoking. If you would like help call [facility] at 1-800-QUIT-NOW (1-800-784-8669) or [place] at 336-586-4000. °Manage your cholesterol °Maintain a desired weight °Control your diabetes °Keep your blood pressure down ° °If you have any questions, please call the office at [phone] ° °

## 2018-03-29 NOTE — Progress Notes (Signed)
Patient ID: Megan Acosta, female   DOB: January 11, 1956, 62 y.o.   MRN: [id number] Comfortable.  No issues with groin access site.  2+ radial pulse on the right.  Dynamically stable.  Myoview was abnormal therefore plan for cardiac cath on Monday.  Discussed with the patient and husband present

## 2018-03-30 ENCOUNTER — Encounter (HOSPITAL_COMMUNITY): Payer: Self-pay

## 2018-03-30 ENCOUNTER — Other Ambulatory Visit: Payer: Self-pay

## 2018-03-30 LAB — PROTIME-INR
INR: 1.19
Prothrombin Time: 15 seconds (ref 11.4–15.2)

## 2018-03-30 MED ORDER — SODIUM CHLORIDE 0.9 % WEIGHT BASED INFUSION
1.0000 mL/kg/h | INTRAVENOUS | Status: DC
  Administered 2018-03-30 – 2018-03-31 (×2): 1 mL/kg/h via INTRAVENOUS

## 2018-03-30 MED ORDER — SODIUM CHLORIDE 0.9% FLUSH
3.0000 mL | Freq: Two times a day (BID) | INTRAVENOUS | Status: DC
  Administered 2018-03-30: 3 mL via INTRAVENOUS

## 2018-03-30 MED ORDER — SODIUM CHLORIDE 0.9% FLUSH: 3.0000 mL | INTRAVENOUS | Status: DC | PRN

## 2018-03-30 MED ORDER — SODIUM CHLORIDE 0.9 % WEIGHT BASED INFUSION: 3.0000 mL/kg/h | INTRAVENOUS | Status: DC

## 2018-03-30 MED ORDER — SODIUM CHLORIDE 0.9 % WEIGHT BASED INFUSION: 1.0000 mL/kg/h | INTRAVENOUS | Status: DC

## 2018-03-30 MED ORDER — ASPIRIN 81 MG PO CHEW: 81.0000 mg | CHEWABLE_TABLET | ORAL | Status: DC

## 2018-03-30 MED ORDER — ASPIRIN 81 MG PO CHEW
81.0000 mg | CHEWABLE_TABLET | ORAL | Status: AC
  Administered 2018-03-31: 81 mg via ORAL
  Filled 2018-03-30: qty 1

## 2018-03-30 MED ORDER — SODIUM CHLORIDE 0.9 % IV SOLN: 250.0000 mL | INTRAVENOUS | Status: DC | PRN

## 2018-03-30 MED ORDER — SODIUM CHLORIDE 0.9 % IV SOLN
250.0000 mL | INTRAVENOUS | Status: DC | PRN
Start: 2018-03-30 — End: 2018-03-31

## 2018-03-30 NOTE — Progress Notes (Signed)
Subjective: Interval History: none.. No further chest pain.  Remains hemodynamically stable.  Objective: Vital signs in last 24 hours: Temp:  [98.1 F (36.7 C)-98.8 F (37.1 C)] 98.1 F (36.7 C) (06/23 0458) Pulse Rate:  [87-125] 107 (06/23 0458) Resp:  [11-21] 18 (06/23 0458) BP: (87-120)/(47-74) 108/65 (06/23 0458) SpO2:  [93 %-96 %] 95 % (06/23 0458)  Intake/Output from previous day: No intake/output data recorded. Intake/Output this shift: No intake/output data recorded.  Physical exam: Palpable right radial pulse  Lab Results: Recent Labs    03/28/18 1157 03/29/18 0335  WBC 10.1 7.0  HGB 9.8* 8.9*  HCT 30.3* 27.7*  PLT 153 161   BMET Recent Labs    03/28/18 1157 03/29/18 0335  NA 141 142  K 3.1* 4.1  CL 112* 109  CO2 23 27  GLUCOSE 113* 85  BUN 10 8  CREATININE 0.59 0.62  CALCIUM 7.4* 8.3*    Studies/Results: Ct Abdomen Pelvis W Contrast  Result Date: 03/28/2018 CLINICAL DATA:  62 year old female status post arteriogram this morning. Right groin site hematoma. Abdominal pain, diaphoresis. EXAM: CT ABDOMEN AND PELVIS WITH CONTRAST TECHNIQUE: Multidetector CT imaging of the abdomen and pelvis was performed using the standard protocol following bolus administration of intravenous contrast. CONTRAST:  65mL ISOVUE-370 IOPAMIDOL (ISOVUE-370) INJECTION 76% COMPARISON:  None. FINDINGS: Lower chest: Negative lung bases. No pericardial or pleural effusion. Hepatobiliary: Negative liver. Vicarious contrast excretion into the gallbladder. No upper abdominal free fluid. Pancreas: Negative. Spleen: Negative. Adrenals/Urinary Tract: Normal adrenal glands. Both kidneys are enhancing and also excreting IV contrast to the renal collecting systems and ureters. Mild mass effect along the course of the distal right ureter related to the pelvic hematoma described below, but no hydroureter or hydronephrosis. Excreted IV contrast throughout the urinary bladder. Mild mass effect on the  bladder from adjacent hematoma described below. Stomach/Bowel: Mild mass effect on the rectum and sigmoid colon from right pelvic sidewall hematoma described below. Retained stool in the distal colon. Similar retained stool throughout the descending colon. Redundant transverse colon. Negative right colon aside from small volume adjacent hematoma in the right pericolic gutter described below. Negative terminal ileum. Appendix is diminutive or absent. No dilated small bowel. Negative stomach and duodenum; small duodenum diverticulum. No abdominal free air. Vascular/Lymphatic: Aortoiliac calcified atherosclerosis. The major arterial structures in the abdomen and pelvis are patent. The right external iliac artery, common femoral artery, and visible right SFA and PFA are patent. These have a similar caliber to the contralateral left external iliac and femoral arteries. Portal venous system is patent. The central abdominal and pelvic veins appear fairly normal in caliber, without suggestion of hypovolemia. No lymphadenopathy. Reproductive: Surgically absent. Multiple bilateral pelvic sidewall surgical clips. Other: Soft tissue stranding throughout the right inguinal region tracking into the proximal anterior right thigh compatible with sequelae of hematoma. Blood products tracking into the pelvis along the right pelvic side wall and into the bilateral space of Retzius greater on the right (series 3, image 71). Trace presacral involvement by the hematoma. Cephalad extension of blood products into the right retroperitoneum and right pericolic gutter (series 3, image 49). See also coronal images 67 and 69. Musculoskeletal: Negative. IMPRESSION: 1. Right inguinal hematoma with extension into the right hemipelvis, bilateral space of Retzius, and right pericolic gutter. The degree of hematoma extension is best depicted on coronal images 67 and 69. 2. Mild associated regional mass effect in the right pelvis and right lower  quadrant. 3. The major arterial structures in  the abdomen, pelvis, and proximal lower extremities remain patent - including the right external iliac and visible femoral arteries. Electronically Signed   By: Genevie Ann M.D.   On: 03/28/2018 15:53   Nm Myocar Multi W/spect W/wall Motion / Ef  Result Date: 03/29/2018  Downsloping ST segment depression ST segment depression was noted during stress in the II, III, aVF, V5 and V6 leads, and returning to baseline after less than 1 minute of recovery.  Defect 1: There is a small defect of severe severity present in the basal inferoseptal location consistent with prior infarct.  Defect 2: There is a medium defect of moderate severity present in the mid inferoseptal and mid inferior location consistent with ischemia.  Nuclear stress EF: 76%. There is basal inferoseptal wall akinesis  Findings consistent with ischemia and prior myocardial infarction.  This is an intermediate risk study.  Candee Furbish, MD   Anti-infectives: Anti-infectives (From admission, onward)   None      Assessment/Plan: s/p Procedure(s) with comments: AORTIC ARCH ANGIOGRAPHY (N/A) UPPER EXTREMITY ANGIOGRAPHY (N/A) PERIPHERAL VASCULAR BALLOON ANGIOPLASTY (Right) - right Axillary Stable overall.  Discussed plan for cardiac catheterization tomorrow   LOS: 2 days   Megan Acosta 03/30/2018, 8:35 AM

## 2018-03-30 NOTE — Progress Notes (Signed)
Progress Note  Patient Name: Megan Acosta Date of Encounter: 03/30/2018  Primary Cardiologist: Shirlee More, MD   Subjective   A bit nervous.  No significant chest pain.  Right groin stable.  Right arm stable.  Inpatient Medications    Scheduled Meds: . aspirin EC  81 mg Oral Daily  . clopidogrel  75 mg Oral Daily  . pantoprazole  40 mg Oral Daily  . regadenoson  0.4 mg Intravenous Once  . rosuvastatin  20 mg Oral Daily  . sodium chloride flush  3 mL Intravenous Q12H  . vortioxetine HBr  10 mg Oral Daily   Continuous Infusions: . sodium chloride     PRN Meds: sodium chloride, acetaminophen, albuterol, ALPRAZolam, bisacodyl, bisacodyl, hydrALAZINE, labetalol, morphine injection, ondansetron (ZOFRAN) IV, oxyCODONE-acetaminophen, sodium chloride flush   Vital Signs    Vitals:   03/29/18 1605 03/29/18 1938 03/29/18 2307 03/30/18 0458  BP: 107/67 98/61 (!) 87/47 108/65  Pulse: 95  (!) 102 (!) 107  Resp: (!) 21 18 13 18   Temp:  98.7 F (37.1 C) 98.8 F (37.1 C) 98.1 F (36.7 C)  TempSrc:   Oral Oral  SpO2: 96% 93% 95% 95%  Weight:      Height:       No intake or output data in the 24 hours ending 03/30/18 1015 Filed Weights   03/28/18 0543 03/29/18 0400  Weight: 141 lb (64 kg) 151 lb 10.8 oz (68.8 kg)    Telemetry    Sinus rhythm- Personally Reviewed  ECG    No new- Personally Reviewed  Physical Exam   GEN: No acute distress.   Neck: No JVD Cardiac: RRR, no murmurs, rubs, or gallops.  Respiratory: Clear to auscultation bilaterally. GI: Soft, nontender, non-distended  MS: No edema; No deformity.  Excellent left radial pulse Neuro:  Nonfocal  Psych: Normal affect   Labs    Chemistry Recent Labs  Lab 03/28/18 0639 03/28/18 1157 03/29/18 0335  NA 141 141 142  K 3.8 3.1* 4.1  CL 103 112* 109  CO2  --  23 27  GLUCOSE 98 113* 85  BUN 14 10 8   CREATININE 0.70 0.59 0.62  CALCIUM  --  7.4* 8.3*  GFRNONAA  --  >60 >60  GFRAA  --  >60 >60    ANIONGAP  --  6 6     Hematology Recent Labs  Lab 03/28/18 0639 03/28/18 1157 03/29/18 0335  WBC  --  10.1 7.0  RBC  --  3.36* 3.10*  HGB 11.9* 9.8* 8.9*  HCT 35.0* 30.3* 27.7*  MCV  --  90.2 89.4  MCH  --  29.2 28.7  MCHC  --  32.3 32.1  RDW  --  13.2 13.4  PLT  --  153 161    Cardiac Enzymes Recent Labs  Lab 03/28/18 1157 03/28/18 2017 03/29/18 0335  TROPONINI <0.03 <0.03 <0.03   No results for input(s): TROPIPOC in the last 168 hours.   BNPNo results for input(s): BNP, PROBNP in the last 168 hours.   DDimer No results for input(s): DDIMER in the last 168 hours.   Radiology    Ct Abdomen Pelvis W Contrast  Result Date: 03/28/2018 CLINICAL DATA:  62 year old female status post arteriogram this morning. Right groin site hematoma. Abdominal pain, diaphoresis. EXAM: CT ABDOMEN AND PELVIS WITH CONTRAST TECHNIQUE: Multidetector CT imaging of the abdomen and pelvis was performed using the standard protocol following bolus administration of intravenous contrast. CONTRAST:  24mL ISOVUE-370 IOPAMIDOL (  ISOVUE-370) INJECTION 76% COMPARISON:  None. FINDINGS: Lower chest: Negative lung bases. No pericardial or pleural effusion. Hepatobiliary: Negative liver. Vicarious contrast excretion into the gallbladder. No upper abdominal free fluid. Pancreas: Negative. Spleen: Negative. Adrenals/Urinary Tract: Normal adrenal glands. Both kidneys are enhancing and also excreting IV contrast to the renal collecting systems and ureters. Mild mass effect along the course of the distal right ureter related to the pelvic hematoma described below, but no hydroureter or hydronephrosis. Excreted IV contrast throughout the urinary bladder. Mild mass effect on the bladder from adjacent hematoma described below. Stomach/Bowel: Mild mass effect on the rectum and sigmoid colon from right pelvic sidewall hematoma described below. Retained stool in the distal colon. Similar retained stool throughout the descending  colon. Redundant transverse colon. Negative right colon aside from small volume adjacent hematoma in the right pericolic gutter described below. Negative terminal ileum. Appendix is diminutive or absent. No dilated small bowel. Negative stomach and duodenum; small duodenum diverticulum. No abdominal free air. Vascular/Lymphatic: Aortoiliac calcified atherosclerosis. The major arterial structures in the abdomen and pelvis are patent. The right external iliac artery, common femoral artery, and visible right SFA and PFA are patent. These have a similar caliber to the contralateral left external iliac and femoral arteries. Portal venous system is patent. The central abdominal and pelvic veins appear fairly normal in caliber, without suggestion of hypovolemia. No lymphadenopathy. Reproductive: Surgically absent. Multiple bilateral pelvic sidewall surgical clips. Other: Soft tissue stranding throughout the right inguinal region tracking into the proximal anterior right thigh compatible with sequelae of hematoma. Blood products tracking into the pelvis along the right pelvic side wall and into the bilateral space of Retzius greater on the right (series 3, image 71). Trace presacral involvement by the hematoma. Cephalad extension of blood products into the right retroperitoneum and right pericolic gutter (series 3, image 49). See also coronal images 67 and 69. Musculoskeletal: Negative. IMPRESSION: 1. Right inguinal hematoma with extension into the right hemipelvis, bilateral space of Retzius, and right pericolic gutter. The degree of hematoma extension is best depicted on coronal images 67 and 69. 2. Mild associated regional mass effect in the right pelvis and right lower quadrant. 3. The major arterial structures in the abdomen, pelvis, and proximal lower extremities remain patent - including the right external iliac and visible femoral arteries. Electronically Signed   By: Genevie Ann M.D.   On: 03/28/2018 15:53   Nm  Myocar Multi W/spect W/wall Motion / Ef  Result Date: 03/29/2018  Downsloping ST segment depression ST segment depression was noted during stress in the II, III, aVF, V5 and V6 leads, and returning to baseline after less than 1 minute of recovery.  Defect 1: There is a small defect of severe severity present in the basal inferoseptal location consistent with prior infarct.  Defect 2: There is a medium defect of moderate severity present in the mid inferoseptal and mid inferior location consistent with ischemia.  Nuclear stress EF: 76%. There is basal inferoseptal wall akinesis  Findings consistent with ischemia and prior myocardial infarction.  This is an intermediate risk study.  Candee Furbish, MD    Cardiac Studies   Nuclear stress test abnormal as above  Patient Profile     62 y.o. female with recent balloon angioplasty of right axillary artery with sheath pull hypotension, vagal response resulting in tachycardia following atropine with resultant chest pain.  Dr. Ellyn Hack ordered her a nuclear stress test.  This was abnormal showing mid inferoseptal and mid inferior ischemia.  There was also basal inferoseptal wall akinesis.  Right groin hematoma noted with mild anemia.  Assessment & Plan    Abnormal nuclear stress test -Cardiac catheterization tomorrow.  Orders have been written.  IV fluids 8 PM tonight.  Left radial.  She had a right arm axillary PTCA performed.  She also has a right groin hematoma, stable.  Troponin is normal.  Anemia - Right groin hematoma in part playing a role.  Appears soft.  There is no evidence of any bulging.  No significant ecchymosis.  Fluids as well.  Stable, continuing to monitor.  Paroxysmal atrial fibrillation 20 years ago.  Maintaining sinus rhythm.  Not on anticoagulation.  Peripheral arterial disease - Right axillary balloon angioplasty.  Right-sided weakness thought to be secondary to the stenosis.  Statin therapy.  Dual antiplatelet therapy aspirin  and Plavix.  Anxiety -Xanax.  Low normal blood pressure - On no antihypertensives.  At home she would take 25 mg of metoprolol tartrate before bedtime.  She is not on anything here.  Asymptomatic.  For questions or updates, please contact [facility] Please consult [email or web address] for contact info under Cardiology/STEMI.      Signed, Candee Furbish, MD  03/30/2018, 10:15 AM

## 2018-03-31 ENCOUNTER — Encounter (HOSPITAL_COMMUNITY)
Admission: RE | Disposition: A | Discharge status: Home / Self Care | Payer: Self-pay | Source: Home / Self Care | Attending: Vascular Surgery

## 2018-03-31 ENCOUNTER — Encounter: Payer: BLUE CROSS/BLUE SHIELD | Admitting: Surgery

## 2018-03-31 ENCOUNTER — Encounter (HOSPITAL_COMMUNITY): Payer: Self-pay | Admitting: Vascular Surgery

## 2018-03-31 DIAGNOSIS — R9439 Abnormal result of other cardiovascular function study: Secondary | ICD-10-CM

## 2018-03-31 DIAGNOSIS — I25110 Atherosclerotic heart disease of native coronary artery with unstable angina pectoris: Secondary | ICD-10-CM

## 2018-03-31 DIAGNOSIS — I200 Unstable angina: Secondary | ICD-10-CM

## 2018-03-31 HISTORY — DX: Abnormal result of other cardiovascular function study: R94.39

## 2018-03-31 HISTORY — PX: LEFT HEART CATH AND CORONARY ANGIOGRAPHY: CATH118249

## 2018-03-31 LAB — LIPID PANEL
Cholesterol: 117 mg/dL (ref 0–200)
HDL: 39 mg/dL — ABNORMAL LOW (ref >40)
LDL Cholesterol: 61 mg/dL (ref 0–99)
Total CHOL/HDL Ratio: 3 RATIO
Triglycerides: 87 mg/dL (ref <150)
VLDL: 17 mg/dL (ref 0–40)

## 2018-03-31 LAB — BASIC METABOLIC PANEL
Anion gap: 6 (ref 5–15)
BUN: 8 mg/dL (ref 6–20)
CHLORIDE: 108 mmol/L (ref 101–111)
CO2: 29 mmol/L (ref 22–32)
CREATININE: 0.69 mg/dL (ref 0.44–1.00)
Calcium: 8.6 mg/dL — ABNORMAL LOW (ref 8.9–10.3)
GFR calc non Af Amer: >60 mL/min (ref >60)
Glucose, Bld: 105 mg/dL — ABNORMAL HIGH (ref 65–99)
POTASSIUM: 3.5 mmol/L (ref 3.5–5.1)
SODIUM: 143 mmol/L (ref 135–145)

## 2018-03-31 LAB — CBC
HEMATOCRIT: 26.6 % — AB (ref 36.0–46.0)
Hemoglobin: 8.6 g/dL — ABNORMAL LOW (ref 12.0–15.0)
MCH: 29.2 pg (ref 26.0–34.0)
MCHC: 32.3 g/dL (ref 30.0–36.0)
MCV: 90.2 fL (ref 78.0–100.0)
Platelets: 176 10*3/uL (ref 150–400)
RBC: 2.95 MIL/uL — ABNORMAL LOW (ref 3.87–5.11)
RDW: 13.5 % (ref 11.5–15.5)
WBC: 7.2 10*3/uL (ref 4.0–10.5)

## 2018-03-31 LAB — MAGNESIUM: Magnesium: 1.1 mg/dL — ABNORMAL LOW (ref 1.7–2.4)

## 2018-03-31 SURGERY — LEFT HEART CATH AND CORONARY ANGIOGRAPHY
Anesthesia: LOCAL

## 2018-03-31 MED ORDER — SODIUM CHLORIDE 0.9% FLUSH: 3.0000 mL | INTRAVENOUS | Status: DC | PRN

## 2018-03-31 MED ORDER — MIDAZOLAM HCL 2 MG/2ML IJ SOLN
INTRAMUSCULAR | Status: AC
  Filled 2018-03-31: qty 2

## 2018-03-31 MED ORDER — HEPARIN SODIUM (PORCINE) 1000 UNIT/ML IJ SOLN
INTRAMUSCULAR | Status: AC
  Filled 2018-03-31: qty 1

## 2018-03-31 MED ORDER — MIDAZOLAM HCL 2 MG/2ML IJ SOLN
INTRAMUSCULAR | Status: DC | PRN
  Administered 2018-03-31: 0.5 mg via INTRAVENOUS

## 2018-03-31 MED ORDER — SODIUM CHLORIDE 0.9 % IV SOLN
INTRAVENOUS | Status: AC
  Administered 2018-03-31: 18:00:00 via INTRAVENOUS

## 2018-03-31 MED ORDER — VERAPAMIL HCL 2.5 MG/ML IV SOLN
INTRAVENOUS | Status: DC | PRN
  Administered 2018-03-31: 10 mL via INTRA_ARTERIAL

## 2018-03-31 MED ORDER — SODIUM CHLORIDE 0.9% FLUSH
3.0000 mL | Freq: Two times a day (BID) | INTRAVENOUS | Status: DC
  Administered 2018-03-31 – 2018-04-01 (×2): 3 mL via INTRAVENOUS

## 2018-03-31 MED ORDER — VERAPAMIL HCL 2.5 MG/ML IV SOLN
INTRAVENOUS | Status: AC
  Filled 2018-03-31: qty 2

## 2018-03-31 MED ORDER — HEPARIN (PORCINE) IN NACL 2-0.9 UNITS/ML
INTRAMUSCULAR | Status: AC | PRN
  Administered 2018-03-31 (×2): 500 mL

## 2018-03-31 MED ORDER — LIDOCAINE HCL (PF) 1 % IJ SOLN
INTRAMUSCULAR | Status: AC
  Filled 2018-03-31: qty 30

## 2018-03-31 MED ORDER — FENTANYL CITRATE (PF) 100 MCG/2ML IJ SOLN
INTRAMUSCULAR | Status: DC | PRN
  Administered 2018-03-31: 12.5 ug via INTRAVENOUS

## 2018-03-31 MED ORDER — LIDOCAINE HCL (PF) 1 % IJ SOLN
INTRAMUSCULAR | Status: DC | PRN
  Administered 2018-03-31: 2 mL

## 2018-03-31 MED ORDER — HEPARIN (PORCINE) IN NACL 1000-0.9 UT/500ML-% IV SOLN
INTRAVENOUS | Status: AC
  Filled 2018-03-31: qty 1000

## 2018-03-31 MED ORDER — FENTANYL CITRATE (PF) 100 MCG/2ML IJ SOLN
INTRAMUSCULAR | Status: AC
  Filled 2018-03-31: qty 2

## 2018-03-31 MED ORDER — SODIUM CHLORIDE 0.9 % IV SOLN: 250.0000 mL | INTRAVENOUS | Status: DC | PRN

## 2018-03-31 MED ORDER — IOHEXOL 350 MG/ML SOLN
INTRAVENOUS | Status: DC | PRN
  Administered 2018-03-31: 40 mL

## 2018-03-31 MED ORDER — HEPARIN SODIUM (PORCINE) 1000 UNIT/ML IJ SOLN
INTRAMUSCULAR | Status: DC | PRN
  Administered 2018-03-31: 3500 [IU] via INTRAVENOUS

## 2018-03-31 SURGICAL SUPPLY — 13 items
CATH INFINITI 5 FR JL3.5 (CATHETERS) ×2 IMPLANT
CATH INFINITI 5FR MULTPACK ANG (CATHETERS) ×2 IMPLANT
COVER PRB 48X5XTLSCP FOLD TPE (BAG) ×1 IMPLANT
COVER PROBE 5X48 (BAG) ×1
DEVICE RAD COMP TR BAND LRG (VASCULAR PRODUCTS) ×2 IMPLANT
GLIDESHEATH SLEND SS 6F .021 (SHEATH) ×2 IMPLANT
GUIDEWIRE INQWIRE 1.5J.035X260 (WIRE) ×1 IMPLANT
INQWIRE 1.5J .035X260CM (WIRE) ×2
KIT HEART LEFT (KITS) ×2 IMPLANT
PACK CARDIAC CATHETERIZATION (CUSTOM PROCEDURE TRAY) ×2 IMPLANT
SHIELD RADPAD SCOOP 12X17 (MISCELLANEOUS) ×2 IMPLANT
TRANSDUCER W/STOPCOCK (MISCELLANEOUS) ×2 IMPLANT
TUBING CIL FLEX 10 FLL-RA (TUBING) ×2 IMPLANT

## 2018-03-31 NOTE — Interval H&P Note (Signed)
History and Physical Interval Note:  03/31/2018 4:57 PM  Megan Acosta  has presented today for cardiac catheterization, with the diagnosis of unstable angina  The various methods of treatment have been discussed with the patient and family. After consideration of risks, benefits and other options for treatment, the patient has consented to  Procedure(s): LEFT HEART CATH AND CORONARY ANGIOGRAPHY (N/A) as a surgical intervention .  The patient's history has been reviewed, patient examined, no change in status, stable for surgery.  I have reviewed the patient's chart and labs.  Questions were answered to the patient's satisfaction.    Cath Lab Visit (complete for each Cath Lab visit)  Clinical Evaluation Leading to the Procedure:   ACS: Yes.    Non-ACS:  N/A  [name]

## 2018-03-31 NOTE — Progress Notes (Addendum)
DAILY PROGRESS NOTE   Patient Name: Megan Acosta Date of Encounter: 03/31/2018  Chief Complaint   Anxious  Patient Profile    62 y.o. female with recent balloon angioplasty of right axillary artery with sheath pull hypotension, vagal response resulting in tachycardia following atropine with resultant chest pain.  Dr. Ellyn Hack ordered her a nuclear stress test.  This was abnormal showing mid inferoseptal and mid inferior ischemia.  There was also basal inferoseptal wall akinesis.  Right groin hematoma noted with mild anemia.  Subjective   No chest pain overnight- anxious. Plan for cath today.  Objective   Vitals:   03/29/18 1938 03/29/18 2307 03/30/18 0458 03/30/18 1105  BP: 98/61 (!) 87/47 108/65   Pulse:  (!) 102 (!) 107   Resp: 18 13 18    Temp: 98.7 F (37.1 C) 98.8 F (37.1 C) 98.1 F (36.7 C)   TempSrc:  Oral Oral   SpO2: 93% 95% 95%   Weight:    151 lb 10.8 oz (68.8 kg)  Height:       No intake or output data in the 24 hours ending 03/31/18 0859 Filed Weights   03/28/18 0543 03/29/18 0400 03/30/18 1105  Weight: 141 lb (64 kg) 151 lb 10.8 oz (68.8 kg) 151 lb 10.8 oz (68.8 kg)    Physical Exam   General appearance: alert and no distress Lungs: clear to auscultation bilaterally Heart: regular rate and rhythm Extremities: large right groin hematoma Neurologic: Grossly normal  Inpatient Medications    Scheduled Meds: . aspirin EC  81 mg Oral Daily  . clopidogrel  75 mg Oral Daily  . pantoprazole  40 mg Oral Daily  . regadenoson  0.4 mg Intravenous Once  . rosuvastatin  20 mg Oral Daily  . sodium chloride flush  3 mL Intravenous Q12H  . sodium chloride flush  3 mL Intravenous Q12H  . sodium chloride flush  3 mL Intravenous Q12H  . vortioxetine HBr  10 mg Oral Daily    Continuous Infusions: . sodium chloride    . sodium chloride    . sodium chloride    . sodium chloride 1 mL/kg/hr (03/30/18 2116)    PRN Meds: sodium chloride, sodium chloride,  sodium chloride, acetaminophen, albuterol, ALPRAZolam, bisacodyl, bisacodyl, hydrALAZINE, labetalol, morphine injection, ondansetron (ZOFRAN) IV, oxyCODONE-acetaminophen, sodium chloride flush, sodium chloride flush, sodium chloride flush   Labs   Results for orders placed or performed during the hospital encounter of 03/28/18 (from the past 48 hour(s))  Protime-INR     Status: None   Collection Time: 03/30/18 11:24 AM  Result Value Ref Range   Prothrombin Time 15.0 11.4 - 15.2 seconds   INR 1.19     Comment: Performed at [facility], [place] [address], [place], [place] [address]  Basic metabolic panel     Status: Abnormal   Collection Time: 03/31/18  3:50 AM  Result Value Ref Range   Sodium 143 135 - 145 mmol/L   Potassium 3.5 3.5 - 5.1 mmol/L   Chloride 108 101 - 111 mmol/L   CO2 29 22 - 32 mmol/L   Glucose, Bld 105 (H) 65 - 99 mg/dL   BUN 8 6 - 20 mg/dL   Creatinine, Ser 0.69 0.44 - 1.00 mg/dL   Calcium 8.6 (L) 8.9 - 10.3 mg/dL   GFR calc non Af Amer >60 >60 mL/min   GFR calc Af Amer >60 >60 mL/min    Comment: (NOTE) The eGFR has been calculated using the  CKD EPI equation. This calculation has not been validated in all clinical situations. eGFR's persistently <60 mL/min signify possible Chronic Kidney Disease.    Anion gap 6 5 - 15    Comment: Performed at [facility] [address] [place], [place] [address]  CBC     Status: Abnormal   Collection Time: 03/31/18  3:50 AM  Result Value Ref Range   WBC 7.2 4.0 - 10.5 K/uL   RBC 2.95 (L) 3.87 - 5.11 MIL/uL   Hemoglobin 8.6 (L) 12.0 - 15.0 g/dL   HCT 26.6 (L) 36.0 - 46.0 %   MCV 90.2 78.0 - 100.0 fL   MCH 29.2 26.0 - 34.0 pg   MCHC 32.3 30.0 - 36.0 g/dL   RDW 13.5 11.5 - 15.5 %   Platelets 176 150 - 400 K/uL    Comment: Performed at [facility], [place] [address], [place], [place] [address]  Magnesium     Status: Abnormal   Collection Time: 03/31/18  3:50 AM  Result Value Ref Range   Magnesium 1.1 (L)  1.7 - 2.4 mg/dL    Comment: Performed at [facility] [address], [place], [place] [address]    ECG   N/A  Telemetry   Sinus - Personally Reviewed  Radiology    Nm Myocar Multi W/spect W/wall Motion / Ef  Result Date: 03/29/2018  Downsloping ST segment depression ST segment depression was noted during stress in the II, III, aVF, V5 and V6 leads, and returning to baseline after less than 1 minute of recovery.  Defect 1: There is a small defect of severe severity present in the basal inferoseptal location consistent with prior infarct.  Defect 2: There is a medium defect of moderate severity present in the mid inferoseptal and mid inferior location consistent with ischemia.  Nuclear stress EF: 76%. There is basal inferoseptal wall akinesis  Findings consistent with ischemia and prior myocardial infarction.  This is an intermediate risk study.  Candee Furbish, MD    Cardiac Studies   Nuclear stress test   Downsloping ST segment depression ST segment depression was noted during stress in the II, III, aVF, V5 and V6 leads, and returning to baseline after less than 1 minute of recovery.  Defect 1: There is a small defect of severe severity present in the basal inferoseptal location consistent with prior infarct.  Defect 2: There is a medium defect of moderate severity present in the mid inferoseptal and mid inferior location consistent with ischemia.  Nuclear stress EF: 76%. There is basal inferoseptal wall akinesis  Findings consistent with ischemia and prior myocardial infarction.  This is an intermediate risk study.   Candee Furbish, MD  Assessment   Active Problems:   PAD (peripheral artery disease) (HCC)   Abnormal nuclear stress test   Plan   Plan for LHC today - probably left radial approach given recent right axillary intervention and large right groin hematoma which is painful. Disposition based on findings. ?lipid profile - I don't see an assessment of  lipids in the system. She has PAD and is on rosuvastatin 20 mg. Check am FLP.  Time Spent Directly with Patient:  I have spent a total of 15 minutes with the patient reviewing hospital notes, telemetry, EKGs, labs and examining the patient as well as establishing an assessment and plan that was discussed personally with the patient. > 50% of time was spent in direct patient care.  Length of Stay:  LOS: 3 days   Pixie Casino, MD, [facility], [place] Director of the Advanced Lipid Disorders &  Cardiovascular Risk Reduction Clinic Diplomate of the American Board of Clinical Lipidology Attending Cardiologist  Direct Dial: [phone]  Fax: [phone]  Website:  [email or web address] 03/31/2018, 8:59 AM

## 2018-03-31 NOTE — H&P (View-Only) (Signed)
DAILY PROGRESS NOTE   Patient Name: Megan Acosta Date of Encounter: 03/31/2018  Chief Complaint   Anxious  Patient Profile    62 y.o. female with recent balloon angioplasty of right axillary artery with sheath pull hypotension, vagal response resulting in tachycardia following atropine with resultant chest pain.  Dr. Ellyn Hack ordered her a nuclear stress test.  This was abnormal showing mid inferoseptal and mid inferior ischemia.  There was also basal inferoseptal wall akinesis.  Right groin hematoma noted with mild anemia.  Subjective   No chest pain overnight- anxious. Plan for cath today.  Objective   Vitals:   03/29/18 1938 03/29/18 2307 03/30/18 0458 03/30/18 1105  BP: 98/61 (!) 87/47 108/65   Pulse:  (!) 102 (!) 107   Resp: 18 13 18    Temp: 98.7 F (37.1 C) 98.8 F (37.1 C) 98.1 F (36.7 C)   TempSrc:  Oral Oral   SpO2: 93% 95% 95%   Weight:    151 lb 10.8 oz (68.8 kg)  Height:       No intake or output data in the 24 hours ending 03/31/18 0859 Filed Weights   03/28/18 0543 03/29/18 0400 03/30/18 1105  Weight: 141 lb (64 kg) 151 lb 10.8 oz (68.8 kg) 151 lb 10.8 oz (68.8 kg)    Physical Exam   General appearance: alert and no distress Lungs: clear to auscultation bilaterally Heart: regular rate and rhythm Extremities: large right groin hematoma Neurologic: Grossly normal  Inpatient Medications    Scheduled Meds: . aspirin EC  81 mg Oral Daily  . clopidogrel  75 mg Oral Daily  . pantoprazole  40 mg Oral Daily  . regadenoson  0.4 mg Intravenous Once  . rosuvastatin  20 mg Oral Daily  . sodium chloride flush  3 mL Intravenous Q12H  . sodium chloride flush  3 mL Intravenous Q12H  . sodium chloride flush  3 mL Intravenous Q12H  . vortioxetine HBr  10 mg Oral Daily    Continuous Infusions: . sodium chloride    . sodium chloride    . sodium chloride    . sodium chloride 1 mL/kg/hr (03/30/18 2116)    PRN Meds: sodium chloride, sodium chloride,  sodium chloride, acetaminophen, albuterol, ALPRAZolam, bisacodyl, bisacodyl, hydrALAZINE, labetalol, morphine injection, ondansetron (ZOFRAN) IV, oxyCODONE-acetaminophen, sodium chloride flush, sodium chloride flush, sodium chloride flush   Labs   Results for orders placed or performed during the hospital encounter of 03/28/18 (from the past 48 hour(s))  Protime-INR     Status: None   Collection Time: 03/30/18 11:24 AM  Result Value Ref Range   Prothrombin Time 15.0 11.4 - 15.2 seconds   INR 1.19     Comment: Performed at [facility], [place] [address], [place], [place] [address]  Basic metabolic panel     Status: Abnormal   Collection Time: 03/31/18  3:50 AM  Result Value Ref Range   Sodium 143 135 - 145 mmol/L   Potassium 3.5 3.5 - 5.1 mmol/L   Chloride 108 101 - 111 mmol/L   CO2 29 22 - 32 mmol/L   Glucose, Bld 105 (H) 65 - 99 mg/dL   BUN 8 6 - 20 mg/dL   Creatinine, Ser 0.69 0.44 - 1.00 mg/dL   Calcium 8.6 (L) 8.9 - 10.3 mg/dL   GFR calc non Af Amer >60 >60 mL/min   GFR calc Af Amer >60 >60 mL/min    Comment: (NOTE) The eGFR has been calculated using the  CKD EPI equation. This calculation has not been validated in all clinical situations. eGFR's persistently <60 mL/min signify possible Chronic Kidney Disease.    Anion gap 6 5 - 15    Comment: Performed at [facility] [address], [place], [place] [address]  CBC     Status: Abnormal   Collection Time: 03/31/18  3:50 AM  Result Value Ref Range   WBC 7.2 4.0 - 10.5 K/uL   RBC 2.95 (L) 3.87 - 5.11 MIL/uL   Hemoglobin 8.6 (L) 12.0 - 15.0 g/dL   HCT 26.6 (L) 36.0 - 46.0 %   MCV 90.2 78.0 - 100.0 fL   MCH 29.2 26.0 - 34.0 pg   MCHC 32.3 30.0 - 36.0 g/dL   RDW 13.5 11.5 - 15.5 %   Platelets 176 150 - 400 K/uL    Comment: Performed at [facility], [place] [address] [place], [place] [address]  Magnesium     Status: Abnormal   Collection Time: 03/31/18  3:50 AM  Result Value Ref Range   Magnesium 1.1 (L)  1.7 - 2.4 mg/dL    Comment: Performed at [facility] [address], [place],  [address]    ECG   N/A  Telemetry   Sinus - Personally Reviewed  Radiology    Nm Myocar Multi W/spect W/wall Motion / Ef  Result Date: 03/29/2018  Downsloping ST segment depression ST segment depression was noted during stress in the II, III, aVF, V5 and V6 leads, and returning to baseline after less than 1 minute of recovery.  Defect 1: There is a small defect of severe severity present in the basal inferoseptal location consistent with prior infarct.  Defect 2: There is a medium defect of moderate severity present in the mid inferoseptal and mid inferior location consistent with ischemia.  Nuclear stress EF: 76%. There is basal inferoseptal wall akinesis  Findings consistent with ischemia and prior myocardial infarction.  This is an intermediate risk study.  Candee Furbish, MD    Cardiac Studies   Nuclear stress test   Downsloping ST segment depression ST segment depression was noted during stress in the II, III, aVF, V5 and V6 leads, and returning to baseline after less than 1 minute of recovery.  Defect 1: There is a small defect of severe severity present in the basal inferoseptal location consistent with prior infarct.  Defect 2: There is a medium defect of moderate severity present in the mid inferoseptal and mid inferior location consistent with ischemia.  Nuclear stress EF: 76%. There is basal inferoseptal wall akinesis  Findings consistent with ischemia and prior myocardial infarction.  This is an intermediate risk study.   Candee Furbish, MD  Assessment   Active Problems:   PAD (peripheral artery disease) (HCC)   Abnormal nuclear stress test   Plan   Plan for LHC today - probably left radial approach given recent right axillary intervention and large right groin hematoma which is painful. Disposition based on findings. ?lipid profile - I don't see an assessment of  lipids in the system. She has PAD and is on rosuvastatin 20 mg. Check am FLP.  Time Spent Directly with Patient:  I have spent a total of 15 minutes with the patient reviewing hospital notes, telemetry, EKGs, labs and examining the patient as well as establishing an assessment and plan that was discussed personally with the patient. > 50% of time was spent in direct patient care.  Length of Stay:  LOS: 3 days   Pixie Casino, MD, [facility], [place] Director of the Advanced Lipid Disorders &  Cardiovascular Risk Reduction Clinic Diplomate of the American Board of Clinical Lipidology Attending Cardiologist  Direct Dial: [phone]  Fax: [phone]  Website:  [email or web address] 03/31/2018, 8:59 AM

## 2018-03-31 NOTE — Progress Notes (Signed)
   VASCULAR SURGERY ASSESSMENT & PLAN:   3 Days Post-Op s/p: PTA left axillary artery. Palpable left radial pulse.  Right groin looks fine.  For heart cath today. (Myoview was positive)  Home when ok with Cardiology.   SUBJECTIVE:   No complaints  PHYSICAL EXAM:   Vitals:   03/29/18 1938 03/29/18 2307 03/30/18 0458 03/30/18 1105  BP: 98/61 (!) 87/47 108/65   Pulse:  (!) 102 (!) 107   Resp: 18 13 18    Temp: 98.7 F (37.1 C) 98.8 F (37.1 C) 98.1 F (36.7 C)   TempSrc:  Oral Oral   SpO2: 93% 95% 95%   Weight:    151 lb 10.8 oz (68.8 kg)  Height:       Palpable left radial pulse Right groin looks fine  LABS:   Lab Results  Component Value Date   WBC 7.2 03/31/2018   HGB 8.6 (L) 03/31/2018   HCT 26.6 (L) 03/31/2018   MCV 90.2 03/31/2018   PLT 176 03/31/2018   Lab Results  Component Value Date   CREATININE 0.69 03/31/2018    PROBLEM LIST:    Active Problems:   PAD (peripheral artery disease) (HCC)  CURRENT MEDS:   . aspirin EC  81 mg Oral Daily  . clopidogrel  75 mg Oral Daily  . pantoprazole  40 mg Oral Daily  . regadenoson  0.4 mg Intravenous Once  . rosuvastatin  20 mg Oral Daily  . sodium chloride flush  3 mL Intravenous Q12H  . sodium chloride flush  3 mL Intravenous Q12H  . sodium chloride flush  3 mL Intravenous Q12H  . vortioxetine HBr  10 mg Oral Daily    Deitra Mayo Beeper: [phone] Office: [phone] 03/31/2018

## 2018-04-01 ENCOUNTER — Encounter (HOSPITAL_COMMUNITY): Payer: Self-pay | Admitting: Internal Medicine

## 2018-04-01 DIAGNOSIS — E876 Hypokalemia: Secondary | ICD-10-CM

## 2018-04-01 HISTORY — DX: Hypomagnesemia: E83.42

## 2018-04-01 HISTORY — DX: Hypokalemia: E87.6

## 2018-04-01 LAB — BASIC METABOLIC PANEL
Anion gap: 7 (ref 5–15)
BUN: 9 mg/dL (ref 6–20)
CALCIUM: 8.5 mg/dL — AB (ref 8.9–10.3)
CO2: 29 mmol/L (ref 22–32)
Chloride: 105 mmol/L (ref 98–111)
Creatinine, Ser: 0.69 mg/dL (ref 0.44–1.00)
GFR calc Af Amer: >60 mL/min (ref >60)
GLUCOSE: 125 mg/dL — AB (ref 70–99)
Potassium: 3.6 mmol/L (ref 3.5–5.1)
Sodium: 141 mmol/L (ref 135–145)

## 2018-04-01 LAB — HEMOGLOBIN AND HEMATOCRIT, BLOOD
HCT: 27.2 % — ABNORMAL LOW (ref 36.0–46.0)
Hemoglobin: 8.7 g/dL — ABNORMAL LOW (ref 12.0–15.0)

## 2018-04-01 LAB — MAGNESIUM: Magnesium: 1.2 mg/dL — ABNORMAL LOW (ref 1.7–2.4)

## 2018-04-01 MED ORDER — POLYETHYLENE GLYCOL 3350 17 G PO PACK
17.0000 g | PACK | Freq: Every day | ORAL | Status: DC
  Administered 2018-04-01 – 2018-04-02 (×2): 17 g via ORAL
  Filled 2018-04-01 (×2): qty 1

## 2018-04-01 MED ORDER — MAGNESIUM OXIDE 400 (241.3 MG) MG PO TABS
400.0000 mg | ORAL_TABLET | Freq: Every day | ORAL | Status: DC
  Administered 2018-04-01 – 2018-04-02 (×2): 400 mg via ORAL
  Filled 2018-04-01 (×2): qty 1

## 2018-04-01 MED ORDER — SODIUM CHLORIDE 0.9 % IV BOLUS
500.0000 mL | Freq: Once | INTRAVENOUS | Status: AC
  Administered 2018-04-01: 500 mL via INTRAVENOUS

## 2018-04-01 MED ORDER — MAGNESIUM SULFATE 4 GM/100ML IV SOLN
4.0000 g | Freq: Once | INTRAVENOUS | Status: AC
  Administered 2018-04-01: 4 g via INTRAVENOUS
  Filled 2018-04-01: qty 100

## 2018-04-01 MED ORDER — POTASSIUM CHLORIDE ER 10 MEQ PO TBCR
30.0000 meq | EXTENDED_RELEASE_TABLET | Freq: Every day | ORAL | Status: DC
  Administered 2018-04-01 – 2018-04-02 (×2): 30 meq via ORAL
  Filled 2018-04-01 (×4): qty 3

## 2018-04-01 NOTE — Progress Notes (Signed)
Pt BP up to 93/50 (63). Took BP in left arm due to the fact that right arm BP still low. Pt informed me that a previous MD told her to request BP be taken in left arm as right arm had a blockage previously that was repaired. Lajoyce Corners, RN

## 2018-04-01 NOTE — Progress Notes (Signed)
Patient c/o constipation Doculax PRN given, patient had a medium BM but feels still constipated, Arbutus Leas , PA paged verbal order received for miralax  17g PO daily, will continue to monitor.

## 2018-04-01 NOTE — Progress Notes (Signed)
DAILY PROGRESS NOTE   Patient Name: Megan Acosta Date of Encounter: 04/01/2018  Chief Complaint   Wants to go home  Patient Profile    62 y.o. female with recent balloon angioplasty of right axillary artery with sheath pull hypotension, vagal response resulting in tachycardia following atropine with resultant chest pain.  Dr. Ellyn Hack ordered her a nuclear stress test.  This was abnormal showing mid inferoseptal and mid inferior ischemia.  There was also basal inferoseptal wall akinesis.  Right groin hematoma noted with mild anemia.  Subjective   BP low overnight- remains in the 20'U systolic when I took it in the room. She is mentating well - I do believe it is accurate. The cause is not clear. She had a large right groin hematoma, but reports pain is improving and her right groin/thigh is soft. No labs today. She has been drinking water throughout the day. BP checked in both arms and is agreeable.   Objective   Vitals:   04/01/18 0459 04/01/18 0725 04/01/18 0822 04/01/18 0823  BP: (!) 93/50 (!) 72/51 (!) 71/26 (!) 78/58  Pulse: 93 91 96 96  Resp: 12 14 (!) 22 15  Temp:  98.4 F (36.9 C) 98.2 F (36.8 C)   TempSrc:  Oral Oral   SpO2: 95% 94% 95% 96%  Weight:      Height:        Intake/Output Summary (Last 24 hours) at 04/01/2018 0954 Last data filed at 04/01/2018 0400 Gross per 24 hour  Intake 846.9 ml  Output -  Net 846.9 ml   Filed Weights   03/28/18 0543 03/29/18 0400 03/30/18 1105  Weight: 141 lb (64 kg) 151 lb 10.8 oz (68.8 kg) 151 lb 10.8 oz (68.8 kg)    Physical Exam   General appearance: alert and no distress Lungs: clear to auscultation bilaterally Heart: regular rate and rhythm Extremities: large right groin hematoma, soft Neurologic: Grossly normal  Inpatient Medications    Scheduled Meds: . aspirin EC  81 mg Oral Daily  . clopidogrel  75 mg Oral Daily  . magnesium oxide  400 mg Oral Daily  . pantoprazole  40 mg Oral Daily  . potassium  chloride  30 mEq Oral Daily  . rosuvastatin  20 mg Oral Daily  . sodium chloride flush  3 mL Intravenous Q12H  . sodium chloride flush  3 mL Intravenous Q12H  . vortioxetine HBr  10 mg Oral Daily    Continuous Infusions: . sodium chloride    . sodium chloride      PRN Meds: sodium chloride, sodium chloride, acetaminophen, albuterol, ALPRAZolam, bisacodyl, bisacodyl, hydrALAZINE, morphine injection, ondansetron (ZOFRAN) IV, oxyCODONE-acetaminophen, sodium chloride flush, sodium chloride flush   Labs   Results for orders placed or performed during the hospital encounter of 03/28/18 (from the past 48 hour(s))  Protime-INR     Status: None   Collection Time: 03/30/18 11:24 AM  Result Value Ref Range   Prothrombin Time 15.0 11.4 - 15.2 seconds   INR 1.19     Comment: Performed at [facility], [address] [place], [place] [address]  Basic metabolic panel     Status: Abnormal   Collection Time: 03/31/18  3:50 AM  Result Value Ref Range   Sodium 143 135 - 145 mmol/L   Potassium 3.5 3.5 - 5.1 mmol/L   Chloride 108 101 - 111 mmol/L   CO2 29 22 - 32 mmol/L   Glucose, Bld 105 (H) 65 - 99  mg/dL   BUN 8 6 - 20 mg/dL   Creatinine, Ser 0.69 0.44 - 1.00 mg/dL   Calcium 8.6 (L) 8.9 - 10.3 mg/dL   GFR calc non Af Amer >60 >60 mL/min   GFR calc Af Amer >60 >60 mL/min    Comment: (NOTE) The eGFR has been calculated using the CKD EPI equation. This calculation has not been validated in all clinical situations. eGFR's persistently <60 mL/min signify possible Chronic Kidney Disease.    Anion gap 6 5 - 15    Comment: Performed at [facility] [address], [place], [place] [address]  CBC     Status: Abnormal   Collection Time: 03/31/18  3:50 AM  Result Value Ref Range   WBC 7.2 4.0 - 10.5 K/uL   RBC 2.95 (L) 3.87 - 5.11 MIL/uL   Hemoglobin 8.6 (L) 12.0 - 15.0 g/dL   HCT 26.6 (L) 36.0 - 46.0 %   MCV 90.2 78.0 - 100.0 fL   MCH 29.2 26.0 - 34.0 pg   MCHC 32.3 30.0 - 36.0  g/dL   RDW 13.5 11.5 - 15.5 %   Platelets 176 150 - 400 K/uL    Comment: Performed at [facility], [place] [address], [place], [place] [address]  Magnesium     Status: Abnormal   Collection Time: 03/31/18  3:50 AM  Result Value Ref Range   Magnesium 1.1 (L) 1.7 - 2.4 mg/dL    Comment: Performed at [facility] [address] [place], [place] [address]  Lipid panel     Status: Abnormal   Collection Time: 03/31/18  9:06 AM  Result Value Ref Range   Cholesterol 117 0 - 200 mg/dL   Triglycerides 87 <150 mg/dL   HDL 39 (L) >40 mg/dL   Total CHOL/HDL Ratio 3.0 RATIO   VLDL 17 0 - 40 mg/dL   LDL Cholesterol 61 0 - 99 mg/dL    Comment:        Total Cholesterol/HDL:CHD Risk Coronary Heart Disease Risk Table                     Men   Women  1/2 Average Risk   3.4   3.3  Average Risk       5.0   4.4  2 X Average Risk   9.6   7.1  3 X Average Risk  23.4   11.0        Use the calculated Patient Ratio above and the CHD Risk Table to determine the patient's CHD Risk.        ATP III CLASSIFICATION (LDL):  <100     mg/dL   Optimal  100-129  mg/dL   Near or Above                    Optimal  130-159  mg/dL   Borderline  160-189  mg/dL   High  >190     mg/dL   Very High Performed at [facility] [address], [place], [place] [address]     ECG   N/A  Telemetry   Sinus - Personally Reviewed  Radiology    No results found.  Cardiac Studies   Procedures   LEFT HEART CATH AND CORONARY ANGIOGRAPHY  Conclusions: 1. Mild to moderate, non-obstructive coronary artery disease, including 20-30% proximal LAD  and 40-50% mid RCA stenoses. 2. Upper normal left ventricular filling pressure.  Recommendations:  1. Medical therapy and risk factor modification to prevent progression of disease.  Nelva Bush, MD [facility] Pager: [phone]    Assessment   Active Problems:   PAD (peripheral artery disease) (HCC)   Abnormal nuclear stress test    Unstable angina (HCC)   Hypokalemia   Hypomagnesemia   Plan   No significant obstructive CAD on cath yesterday. FLP is adequate with LDL 61 and TC 117, non-HDL of 78. Hypotensive overnight and this morning. Magnesium was 1.1 yesterday morning which is severely depleted - potassium also low at 3.5.  Will aggressively replete lytes today with IV magnesium (she is also on po mag oxide) and potassium. Check STAT BMET/Mg and H/H. Would advise monitoring until levels are repleted.  Time Spent Directly with Patient:  I have spent a total of 25 minutes with the patient reviewing hospital notes, telemetry, EKGs, labs and examining the patient as well as establishing an assessment and plan that was discussed personally with the patient. > 50% of time was spent in direct patient care.  Length of Stay:  LOS: 4 days   Pixie Casino, MD, [facility], [place] Director of the Advanced Lipid Disorders &  Cardiovascular Risk Reduction Clinic Diplomate of the American Board of Clinical Lipidology Attending Cardiologist  Direct Dial: [phone]  Fax: [phone]  Website:  [email or web address] 04/01/2018, 9:54 AM

## 2018-04-01 NOTE — Progress Notes (Addendum)
Pt BP 78/66 (72). Notified cardiology. Given verbal order for 0.9% normal saline infusion 568ml over 1 hr. Lajoyce Corners, RN

## 2018-04-01 NOTE — Progress Notes (Signed)
Patient ambulated 639ft in the hall using a walker on room air. Ambulation well tolerated will continue to monitor.

## 2018-04-01 NOTE — Progress Notes (Signed)
   VASCULAR SURGERY ASSESSMENT & PLAN:   STATUS POST ANGIOPLASTY OF THE RIGHT AXILLARY ARTERY.  Patient has a palpable right radial pulse.  CARDIAC: Cardiac catheterization yesterday showed mild to moderate nonobstructive coronary disease with a 20 to 30% proximal LAD and 40 to 50% mid RCA stenosis.  Cardiology has recommended medical management and risk factor modification to prevent progression of disease.  HYPOTENSION: The patient's blood pressure has been running low although she is mentating perfectly well so it is difficult to believe these blood pressures.  We will follow this closely and if her blood pressure improves this morning I think she can be discharged.  HYPOKALEMIA: Her potassium this morning is 3.5.  We can supplement this before discharge.  SUBJECTIVE:   No complaints.  PHYSICAL EXAM:   Vitals:   04/01/18 0100 04/01/18 0322 04/01/18 0459 04/01/18 0725  BP: (!) 78/66 93/62 (!) 93/50 (!) 72/51  Pulse: 90 81 93 91  Resp: 16 11 12 14   Temp:  98.1 F (36.7 C)  98.4 F (36.9 C)  TempSrc:  Oral  Oral  SpO2: 92% 94% 95% 94%  Weight:      Height:       Palpable right radial pulse.  LABS:   Lab Results  Component Value Date   WBC 7.2 03/31/2018   HGB 8.6 (L) 03/31/2018   HCT 26.6 (L) 03/31/2018   MCV 90.2 03/31/2018   PLT 176 03/31/2018   Lab Results  Component Value Date   CREATININE 0.69 03/31/2018   Lab Results  Component Value Date   INR 1.19 03/30/2018    PROBLEM LIST:    Active Problems:   PAD (peripheral artery disease) (HCC)   Abnormal nuclear stress test   Unstable angina (HCC)   CURRENT MEDS:   . aspirin EC  81 mg Oral Daily  . clopidogrel  75 mg Oral Daily  . pantoprazole  40 mg Oral Daily  . rosuvastatin  20 mg Oral Daily  . sodium chloride flush  3 mL Intravenous Q12H  . sodium chloride flush  3 mL Intravenous Q12H  . vortioxetine HBr  10 mg Oral Daily    Deitra Mayo Beeper: [phone] Office:  [phone] 04/01/2018

## 2018-04-02 DIAGNOSIS — I200 Unstable angina: Secondary | ICD-10-CM

## 2018-04-02 LAB — BASIC METABOLIC PANEL
Anion gap: 8 (ref 5–15)
BUN: 8 mg/dL (ref 6–20)
CHLORIDE: 104 mmol/L (ref 98–111)
CO2: 28 mmol/L (ref 22–32)
CREATININE: 0.71 mg/dL (ref 0.44–1.00)
Calcium: 9.1 mg/dL (ref 8.9–10.3)
GFR calc non Af Amer: >60 mL/min (ref >60)
Glucose, Bld: 101 mg/dL — ABNORMAL HIGH (ref 70–99)
POTASSIUM: 4 mmol/L (ref 3.5–5.1)
Sodium: 140 mmol/L (ref 135–145)

## 2018-04-02 LAB — MAGNESIUM: Magnesium: 1.7 mg/dL (ref 1.7–2.4)

## 2018-04-02 MED ORDER — MAGNESIUM OXIDE 400 (241.3 MG) MG PO TABS: 400.0000 mg | ORAL_TABLET | Freq: Two times a day (BID) | ORAL | Status: DC

## 2018-04-02 MED ORDER — MAGNESIUM OXIDE 400 (241.3 MG) MG PO TABS: 400.0000 mg | ORAL_TABLET | Freq: Two times a day (BID) | ORAL | 3 refills | Status: AC

## 2018-04-02 NOTE — Progress Notes (Signed)
   VASCULAR SURGERY ASSESSMENT & PLAN:   Her blood pressure still running low but has certainly improved.  She feels perfectly fine and is ambulating in the halls.  I think she is ready for discharge today.  She will follow-up with her primary care physician concerning her magnesium issues.    I will see her back in 3 to 4 weeks.  SUBJECTIVE:   The patient has no specific complaints this morning.  PHYSICAL EXAM:   Vitals:   04/01/18 2022 04/01/18 2044 04/02/18 0112 04/02/18 0348  BP: (!) 56/42 (!) 84/56 (!) 92/55 (!) 87/59  Pulse: 80  80 92  Resp: 15  12 13   Temp: 98.6 F (37 C)  98.3 F (36.8 C) 98.2 F (36.8 C)  TempSrc: Oral  Oral Oral  SpO2: 93%  93% 91%  Weight:      Height:       She has a palpable right radial pulse. Her right groin looks fine with no significant hematoma.  LABS:   Lab Results  Component Value Date   WBC 7.2 03/31/2018   HGB 8.7 (L) 04/01/2018   HCT 27.2 (L) 04/01/2018   MCV 90.2 03/31/2018   PLT 176 03/31/2018   Lab Results  Component Value Date   CREATININE 0.71 04/02/2018   Lab Results  Component Value Date   INR 1.19 03/30/2018   CBG (last 3)  No results for input(s): GLUCAP in the last 72 hours.  PROBLEM LIST:    Active Problems:   PAD (peripheral artery disease) (HCC)   Abnormal nuclear stress test   Unstable angina (HCC)   Hypokalemia   Hypomagnesemia   CURRENT MEDS:   . aspirin EC  81 mg Oral Daily  . clopidogrel  75 mg Oral Daily  . magnesium oxide  400 mg Oral Daily  . pantoprazole  40 mg Oral Daily  . polyethylene glycol  17 g Oral Daily  . potassium chloride  30 mEq Oral Daily  . rosuvastatin  20 mg Oral Daily  . sodium chloride flush  3 mL Intravenous Q12H  . sodium chloride flush  3 mL Intravenous Q12H  . vortioxetine HBr  10 mg Oral Daily    Deitra Mayo Beeper: [phone] Office: [phone] 04/02/2018

## 2018-04-02 NOTE — Plan of Care (Signed)
  Problem: Clinical Measurements: Goal: Ability to maintain clinical measurements within normal limits will improve Outcome: Progressing  Patient has had issues with there magnesium levels before. She takes it at home.

## 2018-04-02 NOTE — Care Management Note (Signed)
Case Management Note Marvetta Gibbons RN, BSN Unit 4E-Case Manager [phone]  Patient Details  Name: Marciana Uplinger MRN: [id number] Date of Birth: [birth year]  Subjective/Objective:   Pt admitted with PAD, s/p ANGIOPLASTY OF THE RIGHT AXILLARY ARTERY                 Action/Plan: PTA pt lived at home with spouse- plan to return home, no CM needs noted for transition home.  Expected Discharge Date:  04/02/18               Expected Discharge Plan:  Home/Self Care  In-House Referral:  NA  Discharge planning Services  CM Consult  Post Acute Care Choice:    Choice offered to:     DME Arranged:    DME Agency:     HH Arranged:    HH Agency:     Status of Service:  Completed, signed off  If discussed at [place] of Stay Meetings, dates discussed:    Discharge Disposition: home/self care   Additional Comments:  Dawayne Patricia, RN 04/02/2018, 10:11 AM

## 2018-04-02 NOTE — Progress Notes (Signed)
Patient in a stable condition discharge education reviewed with patient she verbalized understanding, iv removed, tele dc ccmd notified, patient belongings at bedside, patient's husband to transport patient home.

## 2018-04-02 NOTE — Progress Notes (Signed)
Improvement in labs noted today - would continue 400 mg MagOX BID at home. Potassium normal. BP improved. Agree with plans for d/c today.   Pixie Casino, MD, [facility], [place] Director of the Advanced Lipid Disorders &  Cardiovascular Risk Reduction Clinic Diplomate of the American Board of Clinical Lipidology Attending Cardiologist  Direct Dial: [phone]  Fax: [phone]  Website:  [email or web address]

## 2018-04-03 NOTE — Discharge Summary (Signed)
Physician Discharge Summary   Patient ID: Megan Acosta [id number] 62 y.o. [birth year]  Admit date: 03/28/2018  Discharge date and time: 04/02/2018 11:00 AM   Admitting Physician: Angelia Mould, MD   Discharge Physician: same  Admission Diagnoses: PAD (peripheral artery disease) ([place]) [I73.9]  Discharge Diagnoses: same  Admission Condition: fair  Discharged Condition: fair  Indication for Admission: symptomatic right upper extremity arterial occlusive disease  Hospital Course: Megan Acosta is a 62 year old female who came in as an outpatient for right upper extremity arteriogram due to numbness and weakness of right arm with evidence of arterial occlusive disease by arterial duplex.  She underwent right arm arteriography via right common femoral artery access with drug-coated balloon angioplasty of right axillary artery by Dr. Scot Dock on 03/28/2018.  She tolerated the procedure well however experienced chest pain postoperatively with concurrent hypotension, vagal response resulting tachycardia following atropine administration.  Cardiology was consulted and patient underwent stress testing POD 1.  Stress test was abnormal showing mid inferoseptal and mid inferior ischemia and thus cardiology proceeded with cardiac catheterization.  Cardiac catheterization was negative and patient will follow up with cardiology as an outpatient.  During course of hospitalization and after work-up by cardiology patient still had electrolyte abnormalities including hypokalemia and hypomagnesemia.  K was replaced and patient was was given IV magnesium over a course of 24 hours.  Electrolytes returned to normal limits and patient was feeling fit fit for discharge home.  She will resume her aspirin and Plavix regimen.  She will also resume her home magnesium regimen.  She will follow-up in office to see Dr. Scot Dock in about 3 to 4 weeks.  At time of discharge patient had a palpable right radial pulse.  Also at  time of discharge patient was still borderline hypotensive however mentating at baseline and was considered asymptomatic.  Discharge instructions were reviewed with the patient and she voiced her understanding.  She was discharged home in stable condition.  Consults: cardiology  Treatments: surgery: Balloon angioplasty of right axillary artery via ultrasound-guided access to right common femoral artery by Dr. Scot Dock on 03/28/2018  Discharge Exam: see progress note 04/02/18 Vitals:   04/02/18 0348 04/02/18 0720  BP: (!) 87/59 93/67  Pulse: 92 87  Resp: 13 14  Temp: 98.2 F (36.8 C) 98.8 F (37.1 C)  SpO2: 91% 94%     Disposition: home  Patient Instructions:  Allergies as of 04/02/2018      Reactions   Penicillins Hives   Patient states she faints after taking medication  Has patient had a PCN reaction causing immediate rash, facial/tongue/throat swelling, SOB or lightheadedness with hypotension: yes Has patient had a PCN reaction causing severe rash involving mucus membranes or skin necrosis: no Has patient had a PCN reaction that required hospitalization:no Has patient had a PCN reaction occurring within the last 10 years: no If all of the above answers are "NO", then may proceed with Cephalosporin use.   Sulfa Antibiotics Hives      Medication List    TAKE these medications   ALPRAZolam 1 MG tablet Commonly known as:  XANAX Take 1 mg by mouth every 8 (eight) hours as needed for anxiety.   aspirin EC 81 MG tablet Take 81 mg by mouth daily.   clopidogrel 75 MG tablet Commonly known as:  PLAVIX TAKE 1 TABLET BY MOUTH ONCE (1) DAILY   Fish Oil 1000 MG Caps Take 1 capsule by mouth 2 (two) times daily.   MAGNESIUM OXIDE 400  PO Take 1 tablet by mouth daily. What changed:  Another medication with the same name was added. Make sure you understand how and when to take each.   magnesium oxide 400 (241.3 Mg) MG tablet Commonly known as:  MAG-OX Take 1 tablet (400 mg total)  by mouth 2 (two) times daily. What changed:  You were already taking a medication with the same name, and this prescription was added. Make sure you understand how and when to take each.   metoprolol tartrate 50 MG tablet Commonly known as:  LOPRESSOR Take 0.5 tablets by mouth at bedtime.   multivitamin with minerals tablet Take 1 tablet by mouth daily.   omeprazole 40 MG capsule Commonly known as:  PRILOSEC TAKE 1 CAPSULE BY MOUTH ONCE (1) DAILY   rosuvastatin 20 MG tablet Commonly known as:  CRESTOR Take 1 tablet (20 mg total) by mouth daily.   TRINTELLIX 10 MG Tabs tablet Generic drug:  vortioxetine HBr Take 1 tablet by mouth daily.   VENTOLIN HFA 108 (90 Base) MCG/ACT inhaler Generic drug:  albuterol Inhale 2 puffs into the lungs every 6 (six) hours as needed for wheezing.      Activity: activity as tolerated Diet: regular diet Wound Care: none needed  Follow-up with Dr. Scot Dock  in 3 weeks.  SignedDagoberto Ligas 04/03/2018 9:16 AM

## 2018-04-15 ENCOUNTER — Telehealth: Payer: Self-pay | Admitting: *Deleted

## 2018-04-15 NOTE — Telephone Encounter (Signed)
Rosann Auerbach, NP at [facility] center called to report that the patient has no palpable pulses in her right foot. The foot is not cool and there appears to be no skin discoloration. The patient is having some serosanguinous drainage coming from her right groin wound x 1 day. Rosann Auerbach states that Megan Acosta is afebrile.   Megan Acosta had right groin cannulation for Arch aortogram done on 03-28-18 by Dr. Scot Dock. I will make her an appt to see our PA tomorrow to evaluate. Dr. Scot Dock is also in the office tomorrow if needed. Rosann Auerbach, NP will instruct the patient regarding this appt and she is in agreement with this plan of treatment.

## 2018-04-16 ENCOUNTER — Ambulatory Visit: Payer: BLUE CROSS/BLUE SHIELD | Admitting: Physician Assistant

## 2018-04-16 ENCOUNTER — Encounter: Payer: Self-pay | Admitting: Physician Assistant

## 2018-04-16 ENCOUNTER — Other Ambulatory Visit: Payer: Self-pay

## 2018-04-16 VITALS — BP 129/74 | HR 73 | Temp 97.6°F | Resp 16 | Ht 61.5 in | Wt 142.0 lb

## 2018-04-16 DIAGNOSIS — M79601 Pain in right arm: Secondary | ICD-10-CM

## 2018-04-16 DIAGNOSIS — I739 Peripheral vascular disease, unspecified: Secondary | ICD-10-CM

## 2018-04-16 HISTORY — DX: Pain in right arm: M79.601

## 2018-04-16 MED ORDER — CLOPIDOGREL BISULFATE 75 MG PO TABS: ORAL_TABLET | ORAL | 1 refills | Status: DC

## 2018-04-16 NOTE — Progress Notes (Signed)
Postoperative Visit (Angio)   History of Present Illness   Megan Acosta is a 62 y.o. female who presents to office s/p RUE arteriogram with [facility] angioplasty of R axillary artery via R CFA catheterization by Dr. Scot Dock on 03/28/18.  Post operatively patient experienced hypotension/vasovagal event with concurrent chest pain.  Cardiology was consulted and performed stress test and subsequent cardiac catheterization.  Cardiac cath was negative and patient has a follow up with Cardiology this coming Monday.  It should also be noted patient had a R groin hematoma after R UE angiogram.  She comes to clinic today having had some bloody drainage in R groin with concurrent pain.  PCP also concerned about lack of palpable pulses R foot.  She denies claudication, rest pain, or active tissue ischemia R foot.  Patient states R arm is no longer cold, numb, or painful compared to before R axillary artery DCB angioplasty.  She is taking aspirin and plavix daily.  She denies tobacco use.  Past Medical History, Past Surgical History, Social History, Family History, Medications, Allergies, and Review of Systems are unchanged from previous office visit.  Current Outpatient Medications  Medication Sig Dispense Refill  . ALPRAZolam (XANAX) 1 MG tablet Take 1 mg by mouth every 8 (eight) hours as needed for anxiety.  2  . aspirin EC 81 MG tablet Take 81 mg by mouth daily.    . clopidogrel (PLAVIX) 75 MG tablet TAKE 1 TABLET BY MOUTH ONCE (1) DAILY 30 tablet 1  . magnesium oxide (MAG-OX) 400 (241.3 Mg) MG tablet Take 1 tablet (400 mg total) by mouth 2 (two) times daily. 60 tablet 3  . MAGNESIUM OXIDE 400 PO Take 1 tablet by mouth daily.    . metoprolol tartrate (LOPRESSOR) 50 MG tablet Take 0.5 tablets by mouth at bedtime.   2  . Multiple Vitamins-Minerals (MULTIVITAMIN WITH MINERALS) tablet Take 1 tablet by mouth daily.    . rosuvastatin (CRESTOR) 20 MG tablet Take 1 tablet (20 mg total) by mouth daily. 90 tablet 3   . TRINTELLIX 10 MG TABS tablet Take 1 tablet by mouth daily.  0  . VENTOLIN HFA 108 (90 Base) MCG/ACT inhaler Inhale 2 puffs into the lungs every 6 (six) hours as needed for wheezing.  0  . Omega-3 Fatty Acids (FISH OIL) 1000 MG CAPS Take 1 capsule by mouth 2 (two) times daily.    Marland Kitchen omeprazole (PRILOSEC) 40 MG capsule TAKE 1 CAPSULE BY MOUTH ONCE (1) DAILY  2   No current facility-administered medications for this visit.     ROS: 10 system ROS negative unless otherwise noted in HPI   For VQI Use Only   PRE-ADM LIVING: Home  AMB STATUS: Ambulatory   Physical Examination   Vitals:   04/16/18 1355  BP: 129/74  Pulse: 73  Resp: 16  Temp: 97.6 F (36.4 C)  TempSrc: Oral  SpO2: 97%  Weight: 142 lb (64.4 kg)  Height: 5' 1.5" (1.562 m)   Body mass index is 26.4 kg/m.  General Alert, O x 3, WD, NAD  Pulmonary Sym exp, good B air movt,   Cardiac RRR, Nl S1, S2,   Vascular Vessel Right Left  Radial Palpable Palpable  Brachial Palpable Palpable  Aorta Not palpable N/A  Femoral palpable but difficult to completely assess due to pain with palpation Palpable  Popliteal Not palpable Not palpable  PT brisk by doppler brisk by doppler  DP brisk by doppler brisk by doppler  Gastrointestinal soft, non-distended, non-tender to palpation,   Musculoskeletal M/S 5/5 throughout  , Extremities without ischemic changes  , No edema present, no discharge or bleeding R groin cath site; no sign of infection    Medical Decision Making   Megan Acosta is a 62 y.o. female who presents with R groin pain and perfusion concern of RLE s/p R CFA catheterization for RUE arteriogram with axillary artery DCB angioplasty.   Perfusion of RLE is unchanged from prior to procedure; she maintains a dopplerable R DP and PTA  R groin with some fullness likely resolving hematoma; low suspicion for pseudoaneurysm currently but this cannot be ruled out  Symptoms of RUE have resolved; patient has palpable  R radial pulse  She will return to office as scheduled for appt with Dr. Scot Dock next week  Plavix was refilled  Dagoberto Ligas PA-C [facility] Office: [phone]

## 2018-04-19 NOTE — Progress Notes (Signed)
Cardiology Office Note:    Date:  04/21/2018   ID:  Bari Mantis, DOB [birth year], MRN [id number]  PCP:  Imagene Riches, NP  Cardiologist:  Shirlee More, MD    Referring MD: Imagene Riches, NP    ASSESSMENT:    1. Hypotension, unspecified hypotension type   2. Abnormal myocardial perfusion study   3. Dyslipidemia   4. PAD (peripheral artery disease) ([place])   5. Hematoma    PLAN:    In order of problems listed above:  1. Resolved I reassured her that this was related to procedure and anesthesia contrast dye is brief and self-limited. 2. She has mild nonobstructive CAD continue medical treatment 3. Stable continue current medical therapy 4. Improved no longer has symptoms of weakness paresthesia and pain in the right upper extremity 5. She has had some drainage from the groin puncture site there is no bruit but she has a pulsatile hematoma in the duplex has been ordered.   Next appointment: 3 months   Medication Adjustments/Labs and Tests Ordered: Current medicines are reviewed at length with the patient today.  Concerns regarding medicines are outlined above.  No orders of the defined types were placed in this encounter.  No orders of the defined types were placed in this encounter.   Chief Complaint  Patient presents with  . Hypotension  . PAD  . Hyperlipidemia    History of Present Illness:    Megan Acosta is a 62 y.o. female with a hx of paroxysmal atrial fibrillation and peripheral arterial disease with recent admission and surgery at [facility] 03/28/2008 for symptomatic right upper extremity arterial occlusive disease she underwent right arm a arteriography and a drug-coated balloon angioplasty of the right axillary artery 03/28/2018 during her hospitalization she had hypotension and a vagal response cardiology was consulted stress test abnormal and coronary angiography performed which showed no significant obstructive CAD she also had hypokalemia requiring  replacement and was discharged in the hospital to resume dual antiplatelet therapy last seen 01/28/18.  She had a recent myocardial perfusion study which showed ischemic ST segment EF 76% with inferior septal wall akinesia basilar segment and findings consistent with ischemia and prior myocardial infarction.  Subsequent left heart catheterization showed mild to moderate nonobstructive coronary artery disease LAD 20 to 30% right coronary artery 40 to 50%. Compliance with diet, lifestyle and medications: Yes  She is improved but quite unsettled she does not understand why she had hypotension and chest pain in hospital.  I reviewed she had a vagal reaction that is a brief self-limited problem related to procedure contrast dye and anesthesia and reassured her.  Her chest pain was in the setting of hypotension and subsequent coronary angiography showed mild nonobstructive CAD.  She will continue current medical treatment with dual antiplatelet lipid-lowering therapy and low-dose beta-blocker. Past Medical History:  Diagnosis Date  . Alcohol dependence, uncomplicated ([place]) 06/15/3531  . Atrial fibrillation ([place]) 01/03/2018  . Benzodiazepine dependence ([place]) 07/12/2016  . Chest pain 01/03/2018  . COPD (chronic obstructive pulmonary disease) ([place]) 01/03/2018  . Depression 01/03/2018  . Dyspnea on exertion 01/03/2018  . Elevated LFTs 07/12/2016  . Hyponatremia 07/12/2016  . Ovarian cancer ([place]) 01/03/2018  . Palpitations 01/03/2018  . PVD (peripheral vascular disease) ([place]) 01/03/2018    Past Surgical History:  Procedure Laterality Date  . ABDOMINAL HYSTERECTOMY    . AORTIC ARCH ANGIOGRAPHY  03/28/2018  . AORTIC ARCH ANGIOGRAPHY N/A 03/28/2018   Procedure: AORTIC ARCH ANGIOGRAPHY;  Surgeon:  Angelia Mould, MD;  Location: [facility];  Service: Cardiovascular;  Laterality: N/A;  . APPENDECTOMY    . CESAREAN SECTION    . CYSTECTOMY    . LEFT HEART CATH AND CORONARY ANGIOGRAPHY N/A 03/31/2018    Procedure: LEFT HEART CATH AND CORONARY ANGIOGRAPHY;  Surgeon: Nelva Bush, MD;  Location: [place] [facility];  Service: Cardiovascular;  Laterality: N/A;  . ORTHOPEDIC SURGERY    . PERIPHERAL VASCULAR BALLOON ANGIOPLASTY Right 03/28/2018   Procedure: PERIPHERAL VASCULAR BALLOON ANGIOPLASTY;  Surgeon: Angelia Mould, MD;  Location: [place] [facility];  Service: Cardiovascular;  Laterality: Right;  right Axillary  . UPPER EXTREMITY ANGIOGRAPHY N/A 03/28/2018   Procedure: UPPER EXTREMITY ANGIOGRAPHY;  Surgeon: Angelia Mould, MD;  Location: [place] [facility];  Service: Cardiovascular;  Laterality: N/A;    Current Medications: Current Meds  Medication Sig  . ALPRAZolam (XANAX) 1 MG tablet Take 1 mg by mouth every 8 (eight) hours as needed for anxiety.  Marland Kitchen aspirin EC 81 MG tablet Take 81 mg by mouth daily.  . clopidogrel (PLAVIX) 75 MG tablet TAKE 1 TABLET BY MOUTH ONCE (1) DAILY  . magnesium oxide (MAG-OX) 400 (241.3 Mg) MG tablet Take 1 tablet (400 mg total) by mouth 2 (two) times daily.  . metoprolol tartrate (LOPRESSOR) 50 MG tablet Take 0.5 tablets by mouth at bedtime.   . Multiple Vitamins-Minerals (MULTIVITAMIN WITH MINERALS) tablet Take 1 tablet by mouth daily.  . rosuvastatin (CRESTOR) 20 MG tablet Take 1 tablet (20 mg total) by mouth daily.  . TRINTELLIX 10 MG TABS tablet Take 1 tablet by mouth daily.  . VENTOLIN HFA 108 (90 Base) MCG/ACT inhaler Inhale 2 puffs into the lungs every 6 (six) hours as needed for wheezing.     Allergies:   Penicillins and Sulfa antibiotics   Social History   Socioeconomic History  . Marital status: Unknown    Spouse name: Not on file  . Number of children: Not on file  . Years of education: Not on file  . Highest education level: Not on file  Occupational History  . Not on file  Social Needs  . Financial resource strain: Not on file  . Food insecurity:    Worry: Not on file    Inability: Not on file  . Transportation needs:     Medical: Not on file    Non-medical: Not on file  Tobacco Use  . Smoking status: Former Smoker    Packs/day: 0.50    Types: Cigarettes  . Smokeless tobacco: Never Used  Substance and Sexual Activity  . Alcohol use: Not Currently    Comment: Pt. reports hx. of alcoholism, last ETOH 07/12/16  . Drug use: Not Currently  . Sexual activity: Not on file  Lifestyle  . Physical activity:    Days per week: Not on file    Minutes per session: Not on file  . Stress: Not on file  Relationships  . Social connections:    Talks on phone: Not on file    Gets together: Not on file    Attends religious service: Not on file    Active member of club or organization: Not on file    Attends meetings of clubs or organizations: Not on file    Relationship status: Not on file  Other Topics Concern  . Not on file  Social History Narrative  . Not on file     Family History: The patient's family history includes  Aneurysm in her father; Diabetes in her brother, brother, brother, and maternal uncle; Healthy in her brother; Heart attack in her brother and brother; Heart disease in her brother and sister; Stroke in her father, mother, sister, and sister. ROS:   Please see the history of present illness.    All other systems reviewed and are negative.  EKGs/Labs/Other Studies Reviewed:    The following studies were reviewed today  Recent Labs: 03/31/2018: Platelets 176 04/01/2018: Hemoglobin 8.7 04/02/2018: BUN 8; Creatinine, Ser 0.71; Magnesium 1.7; Potassium 4.0; Sodium 140  Recent Lipid Panel    Component Value Date/Time   CHOL 117 03/31/2018 0906   TRIG 87 03/31/2018 0906   HDL 39 (L) 03/31/2018 0906   CHOLHDL 3.0 03/31/2018 0906   VLDL 17 03/31/2018 0906   LDLCALC 61 03/31/2018 0906    Physical Exam:    VS:  BP 130/70 (BP Location: Right Arm, Patient Position: Sitting, Cuff Size: Normal)   Pulse 78   Ht 5' 1.5" (1.562 m)   Wt 135 lb 6.4 oz (61.4 kg)   SpO2 95%   BMI 25.17 kg/m       Wt Readings from Last 3 Encounters:  04/21/18 135 lb 6.4 oz (61.4 kg)  04/16/18 142 lb (64.4 kg)  03/30/18 151 lb 10.8 oz (68.8 kg)      GEN:  Well nourished, well developed in no acute distress HEENT: Normal NECK: No JVD; No carotid bruits LYMPHATICS: No lymphadenopathy CARDIAC: RRR, no murmurs, rubs, gallops RESPIRATORY:  Clear to auscultation without rales, wheezing or rhonchi  ABDOMEN: Soft, non-tender, non-distended MUSCULOSKELETAL:  No edema; No deformity   Full pulses both upper estremities SKIN: Warm and dry NEUROLOGIC:  Alert and oriented x 3 PSYCHIATRIC:  Normal affect    Signed, Shirlee More, MD  04/21/2018 8:36 AM    [place]

## 2018-04-21 ENCOUNTER — Ambulatory Visit: Payer: BLUE CROSS/BLUE SHIELD | Admitting: Cardiology

## 2018-04-21 ENCOUNTER — Encounter: Payer: Self-pay | Admitting: Cardiology

## 2018-04-21 VITALS — BP 130/70 | HR 78 | Ht 61.5 in | Wt 135.4 lb

## 2018-04-21 DIAGNOSIS — I739 Peripheral vascular disease, unspecified: Secondary | ICD-10-CM

## 2018-04-21 DIAGNOSIS — R9439 Abnormal result of other cardiovascular function study: Secondary | ICD-10-CM | POA: Insufficient documentation

## 2018-04-21 DIAGNOSIS — I959 Hypotension, unspecified: Secondary | ICD-10-CM | POA: Diagnosis not present

## 2018-04-21 DIAGNOSIS — I25118 Atherosclerotic heart disease of native coronary artery with other forms of angina pectoris: Secondary | ICD-10-CM

## 2018-04-21 DIAGNOSIS — T148XXA Other injury of unspecified body region, initial encounter: Secondary | ICD-10-CM

## 2018-04-21 DIAGNOSIS — E785 Hyperlipidemia, unspecified: Secondary | ICD-10-CM

## 2018-04-21 HISTORY — DX: Abnormal result of other cardiovascular function study: R94.39

## 2018-04-21 HISTORY — DX: Hypotension, unspecified: I95.9

## 2018-04-21 NOTE — Patient Instructions (Addendum)
Medication Instructions:  Your physician recommends that you continue on your current medications as directed. Please refer to the Current Medication list given to you today.   Labwork: NONE  Testing/Procedures: Your physician has requested that you have a lower or upper extremity arterial duplex. This test is an ultrasound of the arteries in the legs or arms. It looks at arterial blood flow in the legs and arms. Allow one hour for Lower and Upper Arterial scans. There are no restrictions or special instructions  Follow-Up: Your physician wants you to follow-up in: 3 months. You will receive a reminder letter in the mail two months in advance. If you don't receive a letter, please call our office to schedule the follow-up appointment.   Any Other Special Instructions Will Be Listed Below (If Applicable).     If you need a refill on your cardiac medications before your next appointment, please call your pharmacy.    Peripheral Vascular Disease Peripheral vascular disease (PVD) is a disease of the blood vessels that are not part of your heart and brain. A simple term for PVD is poor circulation. In most cases, PVD narrows the blood vessels that carry blood from your heart to the rest of your body. This can result in a decreased supply of blood to your arms, legs, and internal organs, like your stomach or kidneys. However, it most often affects a person's lower legs and feet. There are two types of PVD.  Organic PVD. This is the more common type. It is caused by damage to the structure of blood vessels.  Functional PVD. This is caused by conditions that make blood vessels contract and tighten (spasm).  Without treatment, PVD tends to get worse over time. PVD can also lead to acute ischemic limb. This is when an arm or limb suddenly has trouble getting enough blood. This is a medical emergency. Follow these instructions at home:  Take medicines only as told by your doctor.  Do not  use any tobacco products, including cigarettes, chewing tobacco, or electronic cigarettes. If you need help quitting, ask your doctor.  Lose weight if you are overweight, and maintain a healthy weight as told by your doctor.  Eat a diet that is low in fat and cholesterol. If you need help, ask your doctor.  Exercise regularly. Ask your doctor for some good activities for you.  Take good care of your feet. ? Wear comfortable shoes that fit well. ? Check your feet often for any cuts or sores. Contact a doctor if:  You have cramps in your legs while walking.  You have leg pain when you are at rest.  You have coldness in a leg or foot.  Your skin changes.  You are unable to get or have an erection (erectile dysfunction).  You have cuts or sores on your feet that are not healing. Get help right away if:  Your arm or leg turns cold and blue.  Your arms or legs become red, warm, swollen, painful, or numb.  You have chest pain or trouble breathing.  You suddenly have weakness in your face, arm, or leg.  You become very confused or you cannot speak.  You suddenly have a very bad headache.  You suddenly cannot see. This information is not intended to replace advice given to you by your health care provider. Make sure you discuss any questions you have with your health care provider. Document Released: 12/19/2009 Document Revised: 03/01/2016 Document Reviewed: 03/04/2014 Elsevier Interactive Patient Education  2017 Elsevier Inc.  

## 2018-04-23 ENCOUNTER — Ambulatory Visit: Payer: BLUE CROSS/BLUE SHIELD | Admitting: Vascular Surgery

## 2018-04-23 ENCOUNTER — Encounter: Payer: Self-pay | Admitting: Vascular Surgery

## 2018-04-23 ENCOUNTER — Other Ambulatory Visit: Payer: Self-pay

## 2018-04-23 VITALS — BP 132/81 | HR 77 | Temp 97.2°F | Resp 14 | Ht 61.5 in | Wt 139.0 lb

## 2018-04-23 DIAGNOSIS — I70218 Atherosclerosis of native arteries of extremities with intermittent claudication, other extremity: Secondary | ICD-10-CM | POA: Diagnosis not present

## 2018-04-23 NOTE — Progress Notes (Signed)
Patient name: Megan Acosta MRN: [id number] DOB: [birth year] Sex: female  REASON FOR VISIT:   Follow-up after drug-coated balloon angioplasty of the right axillary artery  HPI:   Megan Acosta is a pleasant 62 y.o. female who presented with weakness and numbness in her right upper extremity.  Based on her duplex she had evidence of right upper extremity arterial occlusive disease.  She underwent an arteriogram on 03/28/2018.  She was found to have a 95% stenosis in the right axillary artery.  This was successfully ballooned with a 4 mm x 4 cm loop tonics drug-coated balloon.  There was no residual stenosis.  She comes in for a follow-up visit.  Of note her postprocedural course was complicated by some vague chest symptoms and a vagal response.  This prompted a cardiac evaluation and she underwent cardiac catheterization which showed nonobstructive coronary disease.  Since I saw her last, she denies any significant arm pain.  Her arm feels warmer.  Current Outpatient Medications  Medication Sig Dispense Refill  . ALPRAZolam (XANAX) 1 MG tablet Take 1 mg by mouth every 8 (eight) hours as needed for anxiety.  2  . aspirin EC 81 MG tablet Take 81 mg by mouth daily.    . clopidogrel (PLAVIX) 75 MG tablet TAKE 1 TABLET BY MOUTH ONCE (1) DAILY 30 tablet 1  . magnesium oxide (MAG-OX) 400 (241.3 Mg) MG tablet Take 1 tablet (400 mg total) by mouth 2 (two) times daily. 60 tablet 3  . metoprolol tartrate (LOPRESSOR) 50 MG tablet Take 0.5 tablets by mouth at bedtime.   2  . Multiple Vitamins-Minerals (MULTIVITAMIN WITH MINERALS) tablet Take 1 tablet by mouth daily.    . rosuvastatin (CRESTOR) 20 MG tablet Take 1 tablet (20 mg total) by mouth daily. 90 tablet 3  . TRINTELLIX 10 MG TABS tablet Take 1 tablet by mouth daily.  0  . VENTOLIN HFA 108 (90 Base) MCG/ACT inhaler Inhale 2 puffs into the lungs every 6 (six) hours as needed for wheezing.  0   No current facility-administered medications for this  visit.     REVIEW OF SYSTEMS:  [X]  denotes positive finding, [ ]  denotes negative finding Vascular    Leg swelling    Cardiac    Chest pain or chest pressure:    Shortness of breath upon exertion:    Short of breath when lying flat:    Irregular heart rhythm:    Constitutional    Fever or chills:     PHYSICAL EXAM:   Vitals:   04/23/18 1515  BP: 132/81  Pulse: 77  Resp: 14  Temp: (!) 97.2 F (36.2 C)  TempSrc: Oral  SpO2: 98%  Weight: 139 lb (63 kg)  Height: 5' 1.5" (1.562 m)    GENERAL: The patient is a well-nourished female, in no acute distress. The vital signs are documented above. CARDIOVASCULAR: There is a regular rate and rhythm. PULMONARY: There is good air exchange bilaterally without wheezing or rales. VASCULAR: She has a palpable brachial and radial pulse in the right arm. Her right groin site looks fine without hematoma.  DATA:   No new data.  MEDICAL ISSUES:   STATUS POST ANGIOPLASTY OF A RIGHT AXILLARY ARTERY STENOSIS: The patient has had a nice result after drug-coated balloon angioplasty of a 95% right axillary artery stenosis.  She has a palpable brachial and radial pulse in the right.  Her symptoms have improved significantly.  I have ordered a follow-up duplex and upper extremity  arterial Doppler study in 3 months and I will see her back at that time.  She knows to call sooner if she has problems.  She is on Plavix, aspirin, and a statin.  Deitra Mayo [facility] [phone]

## 2018-05-20 ENCOUNTER — Ambulatory Visit (INDEPENDENT_AMBULATORY_CARE_PROVIDER_SITE_OTHER): Payer: BLUE CROSS/BLUE SHIELD

## 2018-05-20 ENCOUNTER — Telehealth: Payer: Self-pay

## 2018-05-20 DIAGNOSIS — T148XXA Other injury of unspecified body region, initial encounter: Secondary | ICD-10-CM | POA: Diagnosis not present

## 2018-05-20 NOTE — Progress Notes (Signed)
Unilateral right lower extremity arterial duplex exam has been performed. Mildly elevate velocities in the External iliac artery and Profunda femoral artery. Mild atherosclerosis was observed.   [place]

## 2018-05-20 NOTE — Telephone Encounter (Signed)
Left message on patients voicemail to return call to office for results.

## 2018-05-20 NOTE — Telephone Encounter (Signed)
Patient aware of results.

## 2018-07-08 ENCOUNTER — Other Ambulatory Visit: Payer: Self-pay

## 2018-07-08 DIAGNOSIS — I70218 Atherosclerosis of native arteries of extremities with intermittent claudication, other extremity: Secondary | ICD-10-CM

## 2018-07-23 ENCOUNTER — Other Ambulatory Visit: Payer: Self-pay | Admitting: Cardiology

## 2018-07-23 ENCOUNTER — Ambulatory Visit (INDEPENDENT_AMBULATORY_CARE_PROVIDER_SITE_OTHER)
Admission: RE | Admit: 2018-07-23 | Discharge: 2018-07-23 | Disposition: A | Discharge status: Home / Self Care | Payer: BLUE CROSS/BLUE SHIELD | Source: Ambulatory Visit | Attending: Vascular Surgery | Admitting: Vascular Surgery

## 2018-07-23 ENCOUNTER — Encounter: Payer: Self-pay | Admitting: Vascular Surgery

## 2018-07-23 ENCOUNTER — Ambulatory Visit (HOSPITAL_COMMUNITY)
Admission: RE | Admit: 2018-07-23 | Discharge: 2018-07-23 | Disposition: A | Discharge status: Home / Self Care | Payer: BLUE CROSS/BLUE SHIELD | Source: Ambulatory Visit | Attending: Vascular Surgery | Admitting: Vascular Surgery

## 2018-07-23 ENCOUNTER — Ambulatory Visit: Payer: BLUE CROSS/BLUE SHIELD | Admitting: Vascular Surgery

## 2018-07-23 VITALS — BP 101/59 | HR 60 | Temp 97.0°F | Resp 16 | Ht 60.0 in | Wt 136.6 lb

## 2018-07-23 DIAGNOSIS — I739 Peripheral vascular disease, unspecified: Secondary | ICD-10-CM

## 2018-07-23 DIAGNOSIS — I70218 Atherosclerosis of native arteries of extremities with intermittent claudication, other extremity: Secondary | ICD-10-CM | POA: Insufficient documentation

## 2018-07-23 MED ORDER — CLOPIDOGREL BISULFATE 75 MG PO TABS: 75.0000 mg | ORAL_TABLET | Freq: Every day | ORAL | 5 refills | Status: DC

## 2018-07-23 NOTE — Telephone Encounter (Signed)
Patient will be out tomorrow  1. Which medications need to be refilled? (please list name of each medication and dose if known) plavix 75mg   2. Which pharmacy/location (including street and city if local pharmacy) is medication to be sent to? [facility], [place]  3. Do they need a 30 day or 90 day supply? [place]

## 2018-07-23 NOTE — Progress Notes (Signed)
Patient name: Megan Acosta MRN: [id number] DOB: [birth year] Sex: female  REASON FOR VISIT:   Follow-up of drug-coated balloon angioplasty of the right axillary artery.  HPI:   Megan Acosta is a pleasant 62 y.o. female who had presented with weakness and numbness in her right upper extremity.  She underwent an arteriogram on 03/28/2018.  She was found to have a 95% stenosis of her right axillary artery.  This was successfully addressed with balloon angioplasty using a 4 mm x 4 cm drug-coated balloon.  There was no residual stenosis.  I last saw her on 04/23/2018.  At that time, she had a palpable brachial and radial pulse in the right side.  I set up for a 65-month follow-up visit.  She was on Plavix, aspirin, and a statin.  Since I saw her last she has had no upper extremity symptoms besides some occasional paresthesias in the right upper upper extremity.  She denies any neck pain.  She denies any significant claudication or rest pain in her lower extremities.  She quit smoking 2 years ago.  She is on aspirin Plavix and a statin.  Past Medical History:  Diagnosis Date  . Alcohol dependence, uncomplicated ([place]) 78/03/7671  . Atrial fibrillation ([place]) 01/03/2018  . Benzodiazepine dependence ([place]) 07/12/2016  . Chest pain 01/03/2018  . COPD (chronic obstructive pulmonary disease) ([place]) 01/03/2018  . Depression 01/03/2018  . Dyspnea on exertion 01/03/2018  . Elevated LFTs 07/12/2016  . Hyponatremia 07/12/2016  . Ovarian cancer ([place]) 01/03/2018  . Palpitations 01/03/2018  . PVD (peripheral vascular disease) ([place]) 01/03/2018    Family History  Problem Relation Age of Onset  . Stroke Mother   . Aneurysm Father   . Stroke Father   . Diabetes Brother   . Heart attack Brother   . Diabetes Maternal Uncle   . Heart disease Sister        cabg  . Stroke Sister   . Stroke Sister   . Heart attack Brother   . Heart disease Brother        cabg  . Diabetes Brother   . Diabetes Brother   . Healthy  Brother     SOCIAL HISTORY: Social History   Tobacco Use  . Smoking status: Former Smoker    Packs/day: 0.50    Types: Cigarettes  . Smokeless tobacco: Never Used  Substance Use Topics  . Alcohol use: Not Currently    Comment: Pt. reports hx. of alcoholism, last ETOH 07/12/16    Allergies  Allergen Reactions  . Penicillins Hives    Patient states she faints after taking medication  Has patient had a PCN reaction causing immediate rash, facial/tongue/throat swelling, SOB or lightheadedness with hypotension: yes Has patient had a PCN reaction causing severe rash involving mucus membranes or skin necrosis: no Has patient had a PCN reaction that required hospitalization:no Has patient had a PCN reaction occurring within the last 10 years: no If all of the above answers are "NO", then may proceed with Cephalosporin use.   . Sulfa Antibiotics Hives    Current Outpatient Medications  Medication Sig Dispense Refill  . ALPRAZolam (XANAX) 1 MG tablet Take 1 mg by mouth every 8 (eight) hours as needed for anxiety.  2  . aspirin EC 81 MG tablet Take 81 mg by mouth daily.    . clopidogrel (PLAVIX) 75 MG tablet TAKE 1 TABLET BY MOUTH ONCE (1) DAILY 30 tablet 1  . magnesium oxide (MAG-OX) 400 (241.3 Mg) MG tablet  Take 1 tablet (400 mg total) by mouth 2 (two) times daily. 60 tablet 3  . metoprolol tartrate (LOPRESSOR) 50 MG tablet Take 0.5 tablets by mouth at bedtime.   2  . Multiple Vitamins-Minerals (MULTIVITAMIN WITH MINERALS) tablet Take 1 tablet by mouth daily.    . Omega-3 1000 MG CAPS Take 1 capsule by mouth 2 (two) times daily.    . rosuvastatin (CRESTOR) 20 MG tablet Take 1 tablet (20 mg total) by mouth daily. 90 tablet 3  . TRINTELLIX 10 MG TABS tablet Take 1 tablet by mouth daily.  0  . VENTOLIN HFA 108 (90 Base) MCG/ACT inhaler Inhale 2 puffs into the lungs every 6 (six) hours as needed for wheezing.  0   No current facility-administered medications for this visit.     REVIEW  OF SYSTEMS:  [X]  denotes positive finding, [ ]  denotes negative finding Cardiac  Comments:  Chest pain or chest pressure:    Shortness of breath upon exertion:    Short of breath when lying flat:    Irregular heart rhythm:        Vascular    Pain in calf, thigh, or hip brought on by ambulation:    Pain in feet at night that wakes you up from your sleep:     Blood clot in your veins:    Leg swelling:         Pulmonary    Oxygen at home:    Productive cough:     Wheezing:         Neurologic    Sudden weakness in arms or legs:     Sudden numbness in arms or legs:     Sudden onset of difficulty speaking or slurred speech:    Temporary loss of vision in one eye:     Problems with dizziness:         Gastrointestinal    Blood in stool:     Vomited blood:         Genitourinary    Burning when urinating:     Blood in urine:        Psychiatric    Major depression:         Hematologic    Bleeding problems:    Problems with blood clotting too easily:        Skin    Rashes or ulcers:        Constitutional    Fever or chills:     PHYSICAL EXAM:   Vitals:   07/23/18 1411  BP: (!) 101/59  Pulse: 60  Resp: 16  Temp: (!) 97 F (36.1 C)  TempSrc: Oral  SpO2: 98%  Weight: 136 lb 9.6 oz (62 kg)  Height: 5' (1.524 m)    GENERAL: The patient is a well-nourished female, in no acute distress. The vital signs are documented above. CARDIAC: There is a regular rate and rhythm.  VASCULAR: I do not detect carotid bruits. She has a palpable right radial pulse. She has palpable femoral pulses.  Both feet are warm and well-perfused. PULMONARY: There is good air exchange bilaterally without wheezing or rales. ABDOMEN: Soft and non-tender with normal pitched bowel sounds.  MUSCULOSKELETAL: There are no major deformities or cyanosis. NEUROLOGIC: No focal weakness or paresthesias are detected. SKIN: There are no ulcers or rashes noted. PSYCHIATRIC: The patient has a normal  affect.  DATA:    UPPER EXTREMITY DOPPLER: I have independently interpreted her upper extremity arterial Doppler study today.  On the right side there is a triphasic brachial, radial, and ulnar signal.  Digital pressure is 130 mmHg.  On the left side there is a triphasic brachial signal with a biphasic radial and ulnar signal.  Digital pressure is 136 mmHg.  RIGHT UPPER EXTREMITY ARTERIAL DUPLEX: I have independently interpreted her right upper extremity arterial duplex scan.  On the right side there is biphasic flow throughout the area of concern.  There is some mildly elevated velocities in the axillary artery to 250 cm/s.  In August she did have a duplex of her right lower extremity which showed moderate disease in her right external iliac artery and common femoral artery.  MEDICAL ISSUES:   STATUS POST ANGIOPLASTY OF THE RIGHT AXILLARY ARTERY: The axillary artery remains patent with biphasic flow throughout.  She is on Plavix which I think she should continue for another 6 months.  I have written her prescription for this.  She is also on a statin.  I have ordered a follow-up duplex scan in 6 months and I will see her back at that time.  Fortunately she quit smoking 2 years ago.  I encouraged her to stay as active as possible.  RIGHT EXTERNAL ILIAC ARTERY AND COMMON FEMORAL ARTERY MODERATE STENOSIS: Based on her duplex in August she does have some moderate disease of her right external iliac artery and common femoral artery.  I encouraged her to stay as active as possible.  She has minimal symptoms in her right lower extremity.  I have ordered follow-up ABIs in 6 months when I see her back.  She knows to call sooner if she has problems.  Deitra Mayo [facility] [phone]

## 2018-07-23 NOTE — Telephone Encounter (Signed)
Medication was sent in by Dr Scot Dock today

## 2018-07-28 NOTE — Progress Notes (Signed)
Cardiology Office Note:    Date:  07/29/2018   ID:  Megan Acosta, DOB Jul 24, 1956, MRN [id number]  PCP:  Imagene Riches, NP  Cardiologist:  Shirlee More, MD    Referring MD: Imagene Riches, NP    ASSESSMENT:    1. PVD (peripheral vascular disease) ([place])   2. Dyslipidemia   3. Chronic obstructive pulmonary disease, unspecified COPD type ([place])    PLAN:    In order of problems listed above:  1. She has made a full recovery and has had no claudication in the right upper extremity.  She is compliant with medications including high intensity statin and dual antiplatelet and has no claudication lower extremities or TIA.  I will plan to see back in my office in 1 year and continue current treatment. 2. Stable continue high intensity statin most recent lipid profile requested from her PCP goal is an LDL less than 100 ideally less than 70 have additional therapy as needed Zetia plus a high intensity statin would be appropriate 3. Stable managed by her PCP   Next appointment: One year   Medication Adjustments/Labs and Tests Ordered: Current medicines are reviewed at length with the patient today.  Concerns regarding medicines are outlined above.  No orders of the defined types were placed in this encounter.  No orders of the defined types were placed in this encounter.   Chief Complaint  Patient presents with  . Follow-up  . Hyperlipidemia  . PAD    History of Present Illness:    Megan Acosta is a 62 y.o. female with a hx of paroxysmal atrial fibrillation and peripheral arterial disease withsurgery at [facility] 03/28/2008 for symptomatic right upper extremity arterial occlusive disease she underwent right arm a arteriography and a drug-coated balloon angioplasty of the right axillary artery 03/28/2018 during her hospitalization she had hypotension and a vagal response cardiology was consulted stress test abnormal and coronary angiography performed which showed no  significant obstructive CAD she also had hypokalemia requiring replacement and was discharged in the hospital to resume dual antiplatelet therapy  last seen 0715/19.She quit smoking 2 years ago.  She is on aspirin Plavix and a statin.   Compliance with diet, lifestyle and medications: Yes she continues to abstain from smoking  She is pleased with the quality of her life is bothered by minor bruising from aspirin and clopidogrel but has had no claudication in the extremities TIA chest pain shortness of breath palpitation or syncope Past Medical History:  Diagnosis Date  . Alcohol dependence, uncomplicated ([place]) 45/05/997  . Atrial fibrillation ([place]) 01/03/2018  . Benzodiazepine dependence ([place]) 07/12/2016  . Chest pain 01/03/2018  . COPD (chronic obstructive pulmonary disease) ([place]) 01/03/2018  . Depression 01/03/2018  . Dyspnea on exertion 01/03/2018  . Elevated LFTs 07/12/2016  . Hyponatremia 07/12/2016  . Ovarian cancer ([place]) 01/03/2018  . Palpitations 01/03/2018  . PVD (peripheral vascular disease) ([place]) 01/03/2018    Past Surgical History:  Procedure Laterality Date  . ABDOMINAL HYSTERECTOMY    . AORTIC ARCH ANGIOGRAPHY  03/28/2018  . AORTIC ARCH ANGIOGRAPHY N/A 03/28/2018   Procedure: AORTIC ARCH ANGIOGRAPHY;  Surgeon: Angelia Mould, MD;  Location: [place] [facility];  Service: Cardiovascular;  Laterality: N/A;  . APPENDECTOMY    . CESAREAN SECTION    . CYSTECTOMY    . LEFT HEART CATH AND CORONARY ANGIOGRAPHY N/A 03/31/2018   Procedure: LEFT HEART CATH AND CORONARY ANGIOGRAPHY;  Surgeon: Nelva Bush, MD;  Location: [place]  [facility];  Service: Cardiovascular;  Laterality: N/A;  . ORTHOPEDIC SURGERY    . PERIPHERAL VASCULAR BALLOON ANGIOPLASTY Right 03/28/2018   Procedure: PERIPHERAL VASCULAR BALLOON ANGIOPLASTY;  Surgeon: Angelia Mould, MD;  Location: [place] [facility];  Service: Cardiovascular;  Laterality: Right;  right Axillary  . UPPER EXTREMITY ANGIOGRAPHY N/A  03/28/2018   Procedure: UPPER EXTREMITY ANGIOGRAPHY;  Surgeon: Angelia Mould, MD;  Location: [place] [facility];  Service: Cardiovascular;  Laterality: N/A;    Current Medications: Current Meds  Medication Sig  . ALPRAZolam (XANAX) 1 MG tablet Take 1 mg by mouth every 8 (eight) hours as needed for anxiety.  Marland Kitchen aspirin EC 81 MG tablet Take 81 mg by mouth daily.  . clopidogrel (PLAVIX) 75 MG tablet Take 1 tablet (75 mg total) by mouth daily.  . famotidine (PEPCID) 20 MG tablet TAKE 1 TABLET BY MOUTH TWICE (2) DAILY  . magnesium oxide (MAG-OX) 400 (241.3 Mg) MG tablet Take 1 tablet (400 mg total) by mouth 2 (two) times daily.  . metoprolol tartrate (LOPRESSOR) 50 MG tablet Take 0.5 tablets by mouth at bedtime.   . Multiple Vitamins-Minerals (MULTIVITAMIN WITH MINERALS) tablet Take 1 tablet by mouth daily.  . Omega-3 1000 MG CAPS Take 1 capsule by mouth 2 (two) times daily.  . rosuvastatin (CRESTOR) 20 MG tablet Take 1 tablet (20 mg total) by mouth daily.  . TRINTELLIX 10 MG TABS tablet Take 1 tablet by mouth daily.  . VENTOLIN HFA 108 (90 Base) MCG/ACT inhaler Inhale 2 puffs into the lungs every 6 (six) hours as needed for wheezing.     Allergies:   Penicillins and Sulfa antibiotics   Social History   Socioeconomic History  . Marital status: Unknown    Spouse name: Not on file  . Number of children: Not on file  . Years of education: Not on file  . Highest education level: Not on file  Occupational History  . Not on file  Social Needs  . Financial resource strain: Not on file  . Food insecurity:    Worry: Not on file    Inability: Not on file  . Transportation needs:    Medical: Not on file    Non-medical: Not on file  Tobacco Use  . Smoking status: Former Smoker    Packs/day: 0.50    Types: Cigarettes  . Smokeless tobacco: Never Used  Substance and Sexual Activity  . Alcohol use: Not Currently    Comment: Pt. reports hx. of alcoholism, last ETOH 07/12/16  . Drug  use: Not Currently  . Sexual activity: Not on file  Lifestyle  . Physical activity:    Days per week: Not on file    Minutes per session: Not on file  . Stress: Not on file  Relationships  . Social connections:    Talks on phone: Not on file    Gets together: Not on file    Attends religious service: Not on file    Active member of club or organization: Not on file    Attends meetings of clubs or organizations: Not on file    Relationship status: Not on file  Other Topics Concern  . Not on file  Social History Narrative  . Not on file     Family History: The patient's family history includes Aneurysm in her father; Diabetes in her brother, brother, brother, and maternal uncle; Healthy in her brother; Heart attack in her brother and brother; Heart disease in her brother  and sister; Stroke in her father, mother, sister, and sister. ROS:   Please see the history of present illness.    All other systems reviewed and are negative.  EKGs/Labs/Other Studies Reviewed:    The following studies were reviewed today:   Recent Labs: 03/31/2018: Platelets 176 04/01/2018: Hemoglobin 8.7 04/02/2018: BUN 8; Creatinine, Ser 0.71; Magnesium 1.7; Potassium 4.0; Sodium 140  Recent Lipid Panel    Component Value Date/Time   CHOL 117 03/31/2018 0906   TRIG 87 03/31/2018 0906   HDL 39 (L) 03/31/2018 0906   CHOLHDL 3.0 03/31/2018 0906   VLDL 17 03/31/2018 0906   LDLCALC 61 03/31/2018 0906    Physical Exam:    VS:  BP 108/68 (BP Location: Right Arm, Patient Position: Sitting, Cuff Size: Normal)   Pulse 70   Ht 5' 1.5" (1.562 m)   Wt 137 lb 3.2 oz (62.2 kg)   SpO2 97%   BMI 25.50 kg/m     Wt Readings from Last 3 Encounters:  07/29/18 137 lb 3.2 oz (62.2 kg)  07/23/18 136 lb 9.6 oz (62 kg)  04/23/18 139 lb (63 kg)     GEN: She looks older than her age well nourished, well developed in no acute distress HEENT: Normal NECK: No JVD; No carotid bruits LYMPHATICS: No  lymphadenopathy CARDIAC: RRR, no murmurs, rubs, gallops RESPIRATORY:  Clear to auscultation without rales, wheezing or rhonchi  ABDOMEN: Soft, non-tender, non-distended MUSCULOSKELETAL:  No edema; No deformity  SKIN: Warm and dry NEUROLOGIC:  Alert and oriented x 3 PSYCHIATRIC:  Normal affect    Signed, Shirlee More, MD  07/29/2018 5:31 PM    [facility]

## 2018-07-29 ENCOUNTER — Ambulatory Visit: Payer: BLUE CROSS/BLUE SHIELD | Admitting: Cardiology

## 2018-07-29 ENCOUNTER — Encounter: Payer: Self-pay | Admitting: Cardiology

## 2018-07-29 VITALS — BP 108/68 | HR 70 | Ht 61.5 in | Wt 137.2 lb

## 2018-07-29 DIAGNOSIS — I739 Peripheral vascular disease, unspecified: Secondary | ICD-10-CM

## 2018-07-29 DIAGNOSIS — E785 Hyperlipidemia, unspecified: Secondary | ICD-10-CM | POA: Diagnosis not present

## 2018-07-29 DIAGNOSIS — J449 Chronic obstructive pulmonary disease, unspecified: Secondary | ICD-10-CM

## 2018-07-29 NOTE — Patient Instructions (Signed)
Medication Instructions:  Your physician recommends that you continue on your current medications as directed. Please refer to the Current Medication list given to you today.  If you need a refill on your cardiac medications before your next appointment, please call your pharmacy.   Lab work: None  If you have labs (blood work) drawn today and your tests are completely normal, you will receive your results only by: . MyChart Message (if you have MyChart) OR . A paper copy in the mail If you have any lab test that is abnormal or we need to change your treatment, we will call you to review the results.  Testing/Procedures: None  Follow-Up: At [facility], you and your health needs are our priority.  As part of our continuing mission to provide you with exceptional heart care, we have created designated Provider Care Teams.  These Care Teams include your primary Cardiologist (physician) and Advanced Practice Providers (APPs -  Physician Assistants and Nurse Practitioners) who all work together to provide you with the care you need, when you need it. You will need a follow up appointment in 1 years.  Please call our office 2 months in advance to schedule this appointment.   

## 2018-12-23 ENCOUNTER — Other Ambulatory Visit: Payer: Self-pay | Admitting: Cardiology

## 2019-10-06 ENCOUNTER — Other Ambulatory Visit: Payer: Self-pay | Admitting: Cardiology

## 2020-04-22 LAB — COLOGUARD

## 2020-05-04 ENCOUNTER — Other Ambulatory Visit: Payer: Self-pay

## 2020-05-04 ENCOUNTER — Ambulatory Visit (INDEPENDENT_AMBULATORY_CARE_PROVIDER_SITE_OTHER): Payer: BLUE CROSS/BLUE SHIELD | Admitting: Cardiology

## 2020-05-04 VITALS — BP 104/60 | HR 79 | Ht 61.0 in | Wt 145.0 lb

## 2020-05-04 DIAGNOSIS — I739 Peripheral vascular disease, unspecified: Secondary | ICD-10-CM

## 2020-05-04 DIAGNOSIS — E785 Hyperlipidemia, unspecified: Secondary | ICD-10-CM | POA: Diagnosis not present

## 2020-05-04 DIAGNOSIS — I480 Paroxysmal atrial fibrillation: Secondary | ICD-10-CM | POA: Diagnosis not present

## 2020-05-04 DIAGNOSIS — J449 Chronic obstructive pulmonary disease, unspecified: Secondary | ICD-10-CM

## 2020-05-04 MED ORDER — CLOPIDOGREL BISULFATE 75 MG PO TABS: 75.0000 mg | ORAL_TABLET | Freq: Every day | ORAL | 3 refills | Status: AC

## 2020-05-04 MED ORDER — METOPROLOL TARTRATE 50 MG PO TABS: 25.0000 mg | ORAL_TABLET | Freq: Every day | ORAL | 3 refills | Status: AC

## 2020-05-04 MED ORDER — ROSUVASTATIN CALCIUM 40 MG PO TABS: 40.0000 mg | ORAL_TABLET | Freq: Every day | ORAL | 3 refills | Status: DC

## 2020-05-04 MED ORDER — ASPIRIN EC 81 MG PO TBEC: 81.0000 mg | DELAYED_RELEASE_TABLET | Freq: Every day | ORAL | 3 refills | Status: DC

## 2020-05-04 NOTE — Patient Instructions (Signed)

## 2020-05-04 NOTE — Progress Notes (Signed)
Cardiology Office Note:    Date:  05/04/2020   ID:  Dennis Killilea, DOB 09-03-1956, MRN [id number]  PCP:  Imagene Riches, NP  Cardiologist:  Shirlee More, MD    Referring MD: Imagene Riches, NP    ASSESSMENT:    1. Paroxysmal atrial fibrillation (HCC)   2. PAD (peripheral artery disease) ([place])   3. Chronic obstructive pulmonary disease, unspecified COPD type ([place])   4. Dyslipidemia    PLAN:    In order of problems listed above:  1. Stable no recurrence continue beta-blocker and dual antiplatelet therapy with her PAD. I would not place her on anticoagulants unless she has clinical recurrence 2. Stable COPD managed by her PCP continue bronchodilators 3. Continue high intensity statin with her CAD and PAD.   Next appointment: 1 year   Medication Adjustments/Labs and Tests Ordered: Current medicines are reviewed at length with the patient today.  Concerns regarding medicines are outlined above.  No orders of the defined types were placed in this encounter.  No orders of the defined types were placed in this encounter.   No chief complaint on file.   History of Present Illness:    Megan Acosta is a 64 y.o. female with a hx of paroxysmal atrial fibrillation and peripheral arterial disease withsurgery at [facility] 03/28/2008 for symptomatic right upper extremity arterial occlusive disease she underwent right arm a arteriography and a drug-coated balloon angioplasty of the right axillary artery 03/28/2018 during her hospitalization she had hypotension and a vagal response cardiology was consulted stress test abnormal and coronary angiography performed which showed no significant obstructive CAD she also had hypokalemia requiring replacement and was discharged in the hospital to resume dual antiplatelet therapy   She was last seen 07/29/2018. Compliance with diet, lifestyle and medications: Yes  She continues to abstain from smoking she is active works 7 days a week has  mild exertional shortness of breath related to her lung disease no chest pain edema palpitation or syncope. She continues on dual antiplatelet therapy I would not stop. She is not having lower extremity claudication. And she has her lipids followed with her primary care physician her dose of rosuvastatin was increased. She asked me to access a CT of the chest done at [facility] to screen for lung cancer and there was a benign appearance. She has coronary atherosclerosis and has had coronary angiography without occlusive CAD. Past Medical History:  Diagnosis Date   Alcohol dependence, uncomplicated ([place]) 74/0/8144   Atrial fibrillation ([place]) 01/03/2018   Benzodiazepine dependence ([place]) 07/12/2016   Chest pain 01/03/2018   COPD (chronic obstructive pulmonary disease) ([place]) 01/03/2018   Depression 01/03/2018   Dyspnea on exertion 01/03/2018   Elevated LFTs 07/12/2016   Hyponatremia 07/12/2016   Ovarian cancer ([place]) 01/03/2018   Palpitations 01/03/2018   PVD (peripheral vascular disease) ([place]) 01/03/2018    Past Surgical History:  Procedure Laterality Date   ABDOMINAL HYSTERECTOMY     AORTIC ARCH ANGIOGRAPHY  03/28/2018   AORTIC ARCH ANGIOGRAPHY N/A 03/28/2018   Procedure: AORTIC ARCH ANGIOGRAPHY;  Surgeon: Angelia Mould, MD;  Location: [place] [facility];  Service: Cardiovascular;  Laterality: N/A;   APPENDECTOMY     CESAREAN SECTION     CYSTECTOMY     LEFT HEART CATH AND CORONARY ANGIOGRAPHY N/A 03/31/2018   Procedure: LEFT HEART CATH AND CORONARY ANGIOGRAPHY;  Surgeon: Nelva Bush, MD;  Location: [place] [facility];  Service: Cardiovascular;  Laterality: N/A;   ORTHOPEDIC  SURGERY     PERIPHERAL VASCULAR BALLOON ANGIOPLASTY Right 03/28/2018   Procedure: PERIPHERAL VASCULAR BALLOON ANGIOPLASTY;  Surgeon: Angelia Mould, MD;  Location: [place] [facility];  Service: Cardiovascular;  Laterality: Right;  right Axillary   UPPER EXTREMITY ANGIOGRAPHY N/A  03/28/2018   Procedure: UPPER EXTREMITY ANGIOGRAPHY;  Surgeon: Angelia Mould, MD;  Location: [place] [facility];  Service: Cardiovascular;  Laterality: N/A;    Current Medications: No outpatient medications have been marked as taking for the 05/04/20 encounter (Appointment) with Richardo Priest, MD.     Allergies:   Penicillins and Sulfa antibiotics   Social History   Socioeconomic History   Marital status: Unknown    Spouse name: Not on file   Number of children: Not on file   Years of education: Not on file   Highest education level: Not on file  Occupational History   Not on file  Tobacco Use   Smoking status: Former Smoker    Packs/day: 0.50    Types: Cigarettes   Smokeless tobacco: Never Used  Vaping Use   Vaping Use: Former  Substance and Sexual Activity   Alcohol use: Not Currently    Comment: Pt. reports hx. of alcoholism, last ETOH 07/12/16   Drug use: Not Currently   Sexual activity: Not on file  Other Topics Concern   Not on file  Social History Narrative   Not on file   Social Determinants of Health   Financial Resource Strain:    Difficulty of Paying Living Expenses:   Food Insecurity:    Worried About Charity fundraiser in the Last Year:    Arboriculturist in the Last Year:   Transportation Needs:    Film/video editor (Medical):    Lack of Transportation (Non-Medical):   Physical Activity:    Days of Exercise per Week:    Minutes of Exercise per Session:   Stress:    Feeling of Stress :   Social Connections:    Frequency of Communication with Friends and Family:    Frequency of Social Gatherings with Friends and Family:    Attends Religious Services:    Active Member of Clubs or Organizations:    Attends Music therapist:    Marital Status:      Family History: The patient's family history includes Aneurysm in her father; Diabetes in her brother, brother, brother, and maternal uncle;  Healthy in her brother; Heart attack in her brother and brother; Heart disease in her brother and sister; Stroke in her father, mother, sister, and sister. ROS:   Please see the history of present illness.    All other systems reviewed and are negative.  EKGs/Labs/Other Studies Reviewed:    The following studies were reviewed today:  EKG:  EKG ordered today and personally reviewed.  The ekg ordered today demonstrates sinus rhythm normal  Recent Labs: No results found for requested labs within last 8760 hours.  Recent Lipid Panel    Component Value Date/Time   CHOL 117 03/31/2018 0906   TRIG 87 03/31/2018 0906   HDL 39 (L) 03/31/2018 0906   CHOLHDL 3.0 03/31/2018 0906   VLDL 17 03/31/2018 0906   LDLCALC 61 03/31/2018 0906    Physical Exam:    VS:  There were no vitals taken for this visit.    Wt Readings from Last 3 Encounters:  07/29/18 137 lb 3.2 oz (62.2 kg)  07/23/18 136 lb 9.6 oz (62 kg)  04/23/18 139 lb (63 kg)     GEN:  Well nourished, well developed in no acute distress HEENT: Normal NECK: No JVD; No carotid bruits LYMPHATICS: No lymphadenopathy CARDIAC: RRR, no murmurs, rubs, gallops she has full pulses in both upper extremities RESPIRATORY: Hyperinflated prolonged expiration and expiratory wheezing ABDOMEN: Soft, non-tender, non-distended MUSCULOSKELETAL:  No edema; No deformity  SKIN: Warm and dry NEUROLOGIC:  Alert and oriented x 3 PSYCHIATRIC:  Normal affect    Signed, Shirlee More, MD  05/04/2020 2:29 PM    [place]

## 2020-05-17 ENCOUNTER — Telehealth: Payer: Self-pay | Admitting: Cardiology

## 2020-05-17 NOTE — Telephone Encounter (Signed)
° ° °  Pt would like to know if someone found and hearing aide. She said she was in Dr. Joya Gaskins office on 05/04/2020.

## 2020-05-17 NOTE — Telephone Encounter (Signed)
Left patient a detailed message letting her know that I spoke with Lysbeth Galas and Aromas in the front office and they both said that we have not found a hearing aid that they are aware of. I also gave her our call back number to call back with any other issues or concerns.

## 2021-05-10 NOTE — Progress Notes (Addendum)
Cardiology Office Note:    Date:  05/11/2021   ID:  Megan Acosta, DOB 03/11/1956, MRN [id number]  PCP:  Imagene Riches, NP  Cardiologist:  Shirlee More, MD    Referring MD: Imagene Riches, NP    ASSESSMENT:    1. Paroxysmal atrial fibrillation (HCC)   2. Dyslipidemia   3. PAD (peripheral artery disease) ([place])   4. Chronic obstructive pulmonary disease, unspecified COPD type ([place])    PLAN:    In order of problems listed above:  Megan Acosta continues to do well with her atrial fibrillation no recurrence continue beta-blocker and her dual antiplatelet therapy that she takes for stroke prophylaxis with her coincident PAD. Stable I suspect she has a same lipid profile and I will access those records performed at [facility] [place] continue her statin recent lipid profile shows a cholesterol 194 LDL 110 HDL 68 performed at [place] asymptomatic not having claudication Stable she is abstaining from cigarette smoking   Next appointment: 1 year   Medication Adjustments/Labs and Tests Ordered: Current medicines are reviewed at length with the patient today.  Concerns regarding medicines are outlined above.  No orders of the defined types were placed in this encounter.  No orders of the defined types were placed in this encounter.   Chief Complaint  Patient presents with   Follow-up   Atrial Fibrillation    History of Present Illness:    Megan Acosta is a 65 y.o. female with a hx of paroxysmal atrial fibrillation with dual antiplatelet therapy for stroke prophylaxis, COPD peripheral arterial disease and dyslipidemia last seen 04/14/2020 maintaining sinus rhythm.  She had intervention June 2009 [facility] for upper extremity right arterial occlusive disease and subsequently PCI with drug-coated balloon angioplasty of the right axillary artery in June 2019.  Coronary arteriography at that time showed no obstructive CAD.  Compliance with diet, lifestyle and  medications: Yes  She anticipates cervical spine disc surgery and I told her very simple and she can withdraw antiplatelet agents aspirin clopidogrel 5 to 7 days before at least resume aspirin 24 to 48 hours afterwards and clopidogrel when safe from bleeding perspective.  I will send a copy to her surgeon I do think she requires any further preoperative cardiology testing. She has had no recurrence of atrial fibrillation and is not having shortness of breath chest pain palpitation or syncope. Recent EKG with her PCP was good and I am going to have her wait in the office for report is available to review She had lab work done recently at [facility] copies are pending.  Her previous lipid profile in the same treatment was at target 2019 Past Medical History:  Diagnosis Date   Alcohol dependence, uncomplicated ([place]) 123456   Atrial fibrillation ([place]) 01/03/2018   Benzodiazepine dependence ([place]) 07/12/2016   Chest pain 01/03/2018   COPD (chronic obstructive pulmonary disease) ([place]) 01/03/2018   Depression 01/03/2018   Dyspnea on exertion 01/03/2018   Elevated LFTs 07/12/2016   Hyponatremia 07/12/2016   Ovarian cancer ([place]) 01/03/2018   Palpitations 01/03/2018   PVD (peripheral vascular disease) ([place]) 01/03/2018    Past Surgical History:  Procedure Laterality Date   ABDOMINAL HYSTERECTOMY     AORTIC ARCH ANGIOGRAPHY  03/28/2018   AORTIC ARCH ANGIOGRAPHY N/A 03/28/2018   Procedure: AORTIC ARCH ANGIOGRAPHY;  Surgeon: Angelia Mould, MD;  Location: [facility];  Service: Cardiovascular;  Laterality: N/A;   APPENDECTOMY     CESAREAN SECTION  CYSTECTOMY     LEFT HEART CATH AND CORONARY ANGIOGRAPHY N/A 03/31/2018   Procedure: LEFT HEART CATH AND CORONARY ANGIOGRAPHY;  Surgeon: Nelva Bush, MD;  Location: [facility];  Service: Cardiovascular;  Laterality: N/A;   ORTHOPEDIC SURGERY     PERIPHERAL VASCULAR BALLOON ANGIOPLASTY Right 03/28/2018   Procedure: PERIPHERAL  VASCULAR BALLOON ANGIOPLASTY;  Surgeon: Angelia Mould, MD;  Location: [facility];  Service: Cardiovascular;  Laterality: Right;  right Axillary   UPPER EXTREMITY ANGIOGRAPHY N/A 03/28/2018   Procedure: UPPER EXTREMITY ANGIOGRAPHY;  Surgeon: Angelia Mould, MD;  Location: [facility];  Service: Cardiovascular;  Laterality: N/A;    Current Medications: Current Meds  Medication Sig   ALPRAZolam (XANAX) 1 MG tablet Take 1 mg by mouth every 8 (eight) hours as needed for anxiety.   aspirin EC 81 MG tablet Take 1 tablet (81 mg total) by mouth daily.   clopidogrel (PLAVIX) 75 MG tablet Take 1 tablet (75 mg total) by mouth daily.   famotidine (PEPCID) 20 MG tablet TAKE 1 TABLET BY MOUTH TWICE (2) DAILY   fluticasone (FLONASE) 50 MCG/ACT nasal spray Place 1 spray into both nostrils as needed for allergies.   gabapentin (NEURONTIN) 600 MG tablet Take 600 mg by mouth 3 (three) times daily as needed for pain.   glucosamine-chondroitin 500-400 MG tablet Take 1 tablet by mouth 2 (two) times daily.   magnesium oxide (MAG-OX) 400 (241.3 Mg) MG tablet Take 1 tablet (400 mg total) by mouth 2 (two) times daily.   metoprolol tartrate (LOPRESSOR) 50 MG tablet Take 0.5 tablets (25 mg total) by mouth at bedtime.   Multiple Vitamins-Minerals (MULTIVITAMIN WITH MINERALS) tablet Take 1 tablet by mouth daily.   rosuvastatin (CRESTOR) 40 MG tablet Take 1 tablet (40 mg total) by mouth daily.   SYMBICORT 160-4.5 MCG/ACT inhaler Inhale 1 puff into the lungs as needed (shortness of breath).   TRINTELLIX 10 MG TABS tablet Take 1 tablet by mouth daily.   VENTOLIN HFA 108 (90 Base) MCG/ACT inhaler Inhale 2 puffs into the lungs every 6 (six) hours as needed for wheezing.     Allergies:   Penicillins and Sulfa antibiotics   Social History   Socioeconomic History   Marital status: Unknown    Spouse name: Not on file   Number of children: Not on file   Years of education: Not on file    Highest education level: Not on file  Occupational History   Not on file  Tobacco Use   Smoking status: Former    Packs/day: 0.50    Types: Cigarettes   Smokeless tobacco: Never  Vaping Use   Vaping Use: Former  Substance and Sexual Activity   Alcohol use: Not Currently    Comment: Pt. reports hx. of alcoholism, last ETOH 07/12/16   Drug use: Not Currently   Sexual activity: Not on file  Other Topics Concern   Not on file  Social History Narrative   Not on file   Social Determinants of Health   Financial Resource Strain: Not on file  Food Insecurity: Not on file  Transportation Needs: Not on file  Physical Activity: Not on file  Stress: Not on file  Social Connections: Not on file     Family History: The patient's family history includes Aneurysm in her father; Diabetes in her brother, brother, brother, and maternal uncle; Healthy in her brother; Heart attack in her brother and brother; Heart disease in her brother and  sister; Stroke in her father, mother, sister, and sister. ROS:   Please see the history of present illness.    All other systems reviewed and are negative.  EKGs/Labs/Other Studies Reviewed:    The following studies were reviewed today:  EKG:  EKG performed 04/25/2021 under PCP office shows sinus rhythm nonspecific ST abnormality.  Independently reviewed   Recent Labs: No results found for requested labs within last 8760 hours.  Recent Lipid Panel    Component Value Date/Time   CHOL 117 03/31/2018 0906   TRIG 87 03/31/2018 0906   HDL 39 (L) 03/31/2018 0906   CHOLHDL 3.0 03/31/2018 0906   VLDL 17 03/31/2018 0906   LDLCALC 61 03/31/2018 0906    Physical Exam:    VS:  BP 100/60 (BP Location: Left Arm, Patient Position: Sitting, Cuff Size: Normal)   Pulse 64   Ht '5\' 1"'$  (1.549 m)   Wt 154 lb (69.9 kg)   SpO2 96%   BMI 29.10 kg/m     Wt Readings from Last 3 Encounters:  05/11/21 154 lb (69.9 kg)  05/04/20 145 lb (65.8 kg)  07/29/18 137 lb  3.2 oz (62.2 kg)    Pulses present left upper extremity but diminished compared to the left GEN:  Well nourished, well developed in no acute distress HEENT: Normal NECK: No JVD; No carotid bruits LYMPHATICS: No lymphadenopathy CARDIAC: RRR, no murmurs, rubs, gallops RESPIRATORY:  Clear to auscultation without rales, wheezing or rhonchi  ABDOMEN: Soft, non-tender, non-distended MUSCULOSKELETAL:  No edema; No deformity  SKIN: Warm and dry NEUROLOGIC:  Alert and oriented x 3 PSYCHIATRIC:  Normal affect    Signed, Shirlee More, MD  05/11/2021 11:19 AM    [place]

## 2021-05-11 ENCOUNTER — Encounter: Payer: Self-pay | Admitting: Cardiology

## 2021-05-11 ENCOUNTER — Other Ambulatory Visit: Payer: Self-pay

## 2021-05-11 ENCOUNTER — Ambulatory Visit (INDEPENDENT_AMBULATORY_CARE_PROVIDER_SITE_OTHER): Payer: 59 | Admitting: Cardiology

## 2021-05-11 VITALS — BP 100/60 | HR 64 | Ht 61.0 in | Wt 154.0 lb

## 2021-05-11 DIAGNOSIS — J449 Chronic obstructive pulmonary disease, unspecified: Secondary | ICD-10-CM

## 2021-05-11 DIAGNOSIS — I739 Peripheral vascular disease, unspecified: Secondary | ICD-10-CM

## 2021-05-11 DIAGNOSIS — I480 Paroxysmal atrial fibrillation: Secondary | ICD-10-CM | POA: Diagnosis not present

## 2021-05-11 DIAGNOSIS — E785 Hyperlipidemia, unspecified: Secondary | ICD-10-CM | POA: Diagnosis not present

## 2021-05-11 NOTE — Patient Instructions (Signed)
Medication Instructions:  Your physician recommends that you continue on your current medications as directed. Please refer to the Current Medication list given to you today.  *If you need a refill on your cardiac medications before your next appointment, please call your pharmacy*   Lab Work: None If you have labs (blood work) drawn today and your tests are completely normal, you will receive your results only by: [place] (if you have MyChart) OR A paper copy in the mail If you have any lab test that is abnormal or we need to change your treatment, we will call you to review the results.   Testing/Procedures: None   Follow-Up: At [facility], you and your health needs are our priority.  As part of our continuing mission to provide you with exceptional heart care, we have created designated Provider Care Teams.  These Care Teams include your primary Cardiologist (physician) and Advanced Practice Providers (APPs -  Physician Assistants and Nurse Practitioners) who all work together to provide you with the care you need, when you need it.  We recommend signing up for the patient portal called "MyChart".  Sign up information is provided on this After Visit Summary.  MyChart is used to connect with patients for Virtual Visits (Telemedicine).  Patients are able to view lab/test results, encounter notes, upcoming appointments, etc.  Non-urgent messages can be sent to your provider as well.   To learn more about what you can do with MyChart, go to [email or web address].    Your next appointment:   1 year(s)  The format for your next appointment:   In person  Provider:   Shirlee More, MD   Other Instructions

## 2021-06-15 ENCOUNTER — Other Ambulatory Visit: Payer: Self-pay | Admitting: Cardiology

## 2022-03-01 ENCOUNTER — Other Ambulatory Visit: Payer: Self-pay | Admitting: Specialist

## 2022-03-01 DIAGNOSIS — F17219 Nicotine dependence, cigarettes, with unspecified nicotine-induced disorders: Secondary | ICD-10-CM

## 2022-04-24 DIAGNOSIS — R319 Hematuria, unspecified: Secondary | ICD-10-CM | POA: Diagnosis not present

## 2022-04-24 DIAGNOSIS — Z5181 Encounter for therapeutic drug level monitoring: Secondary | ICD-10-CM | POA: Diagnosis not present

## 2022-04-24 DIAGNOSIS — Z79899 Other long term (current) drug therapy: Secondary | ICD-10-CM | POA: Diagnosis not present

## 2022-04-24 DIAGNOSIS — E559 Vitamin D deficiency, unspecified: Secondary | ICD-10-CM | POA: Diagnosis not present

## 2022-05-09 ENCOUNTER — Other Ambulatory Visit: Payer: Self-pay | Admitting: Family

## 2022-05-09 DIAGNOSIS — Z87891 Personal history of nicotine dependence: Secondary | ICD-10-CM

## 2022-06-19 LAB — COLOGUARD: COLOGUARD: NEGATIVE

## 2022-06-19 NOTE — Progress Notes (Unsigned)
Cardiology Office Note:    Date:  06/20/2022   ID:  Megan Acosta, DOB 1955-11-01, MRN [id number]  PCP:  Rhea Bleacher, NP  Cardiologist:  Shirlee More, MD    Referring MD: Rhea Bleacher, NP    ASSESSMENT:    1. Exertional chest pain   2. Paroxysmal atrial fibrillation (HCC)   3. PAD (peripheral artery disease) ([place])   4. Dyslipidemia   5. Chronic obstructive pulmonary disease, unspecified COPD type ([place])    PLAN:    In order of problems listed above:  She is having a stable pattern of exertional chest pain quite suggestive of angina superimposed on background history of peripheral arterial disease.  She is on good medical therapy including dual antiplatelet beta-blocker and lipid-lowering will undergo cardiac CTA to define the presence of CAD and severity and guide treatment if she did need revascularization.  In the interim I do not think she needs to stop her walking program. Stable no recurrence of atrial fibrillation continue her dual antiplatelet therapy Stable PAD she is not having claudication Continue her high intensity statin Stable COPD continue her current bronchodilators with a good result   Next appointment: 1 year if there is abnormality of concern on CT I will bring her back to my office   Medication Adjustments/Labs and Tests Ordered: Current medicines are reviewed at length with the patient today.  Concerns regarding medicines are outlined above.  No orders of the defined types were placed in this encounter.  No orders of the defined types were placed in this encounter.   She is seen in follow-up yearly today but her clinical problem is exertional chest tightness quite suggestive of angina   History of Present Illness:    Megan Acosta is a 66 y.o. female with a hx of paroxysmal atrial fibrillation with dual platelet therapy for stroke prophylaxis peripheral arterial disease dyslipidemia and COPD last seen 05/11/2021.She had intervention June [facility] for upper extremity right arterial occlusive disease and subsequently PCI with drug-coated balloon angioplasty of the right axillary artery in June 2019. Coronary arteriography at that time showed no obstructive CAD.   Recent labs 04/24/2022: Hemoglobin A1c at target 5.5% CMP with a potassium 4.1 sodium 140 creatinine 0.5 GFR 104 cc/min normal liver function test Cholesterol 203 with an LDL of 125 CBC with a hemoglobin of 13.2 and platelets 172,000.  Compliance with diet, lifestyle and medications: Yes she is very focused on her health engage in a walking program compliant with her medications has never resumed smoking after her PCI  Overall she has done well tolerates her statin without muscle pain or weakness and remains on dual antiplatelet therapy. She initiated her walking program she gets mild chest tightness not severe or sustained and resolves that she continues to walk she does not have episodes at rest or nocturnal Edema shortness of breath claudication palpitation or syncope Past Medical History:  Diagnosis Date   Alcohol dependence, uncomplicated ([place]) 40/0/8676   Atrial fibrillation ([place]) 01/03/2018   Benzodiazepine dependence ([place]) 07/12/2016   Chest pain 01/03/2018   COPD (chronic obstructive pulmonary disease) ([place]) 01/03/2018   Depression 01/03/2018   Dyspnea on exertion 01/03/2018   Elevated LFTs 07/12/2016   Hyponatremia 07/12/2016   Ovarian cancer ([place]) 01/03/2018   Palpitations 01/03/2018   PVD (peripheral vascular disease) ([place]) 01/03/2018    Past Surgical History:  Procedure Laterality Date   ABDOMINAL HYSTERECTOMY     AORTIC ARCH ANGIOGRAPHY  03/28/2018  AORTIC ARCH ANGIOGRAPHY N/A 03/28/2018   Procedure: AORTIC ARCH ANGIOGRAPHY;  Surgeon: Angelia Mould, MD;  Location: [facility];  Service: Cardiovascular;  Laterality: N/A;   APPENDECTOMY     CESAREAN SECTION     CYSTECTOMY     LEFT HEART CATH AND CORONARY ANGIOGRAPHY N/A  03/31/2018   Procedure: LEFT HEART CATH AND CORONARY ANGIOGRAPHY;  Surgeon: Nelva Bush, MD;  Location: [facility];  Service: Cardiovascular;  Laterality: N/A;   ORTHOPEDIC SURGERY     PERIPHERAL VASCULAR BALLOON ANGIOPLASTY Right 03/28/2018   Procedure: PERIPHERAL VASCULAR BALLOON ANGIOPLASTY;  Surgeon: Angelia Mould, MD;  Location: [facility];  Service: Cardiovascular;  Laterality: Right;  right Axillary   UPPER EXTREMITY ANGIOGRAPHY N/A 03/28/2018   Procedure: UPPER EXTREMITY ANGIOGRAPHY;  Surgeon: Angelia Mould, MD;  Location: [facility];  Service: Cardiovascular;  Laterality: N/A;    Current Medications: Current Meds  Medication Sig   ALPRAZolam (XANAX) 1 MG tablet Take 1 mg by mouth every 8 (eight) hours as needed for anxiety.   clopidogrel (PLAVIX) 75 MG tablet Take 1 tablet (75 mg total) by mouth daily.   famotidine (PEPCID) 20 MG tablet TAKE 1 TABLET BY MOUTH TWICE (2) DAILY   fluticasone (FLONASE) 50 MCG/ACT nasal spray Place 1 spray into both nostrils as needed for allergies.   gabapentin (NEURONTIN) 600 MG tablet Take 600 mg by mouth 3 (three) times daily as needed for pain.   glucosamine-chondroitin 500-400 MG tablet Take 1 tablet by mouth 2 (two) times daily.   magnesium oxide (MAG-OX) 400 (241.3 Mg) MG tablet Take 1 tablet (400 mg total) by mouth 2 (two) times daily.   metoprolol tartrate (LOPRESSOR) 50 MG tablet Take 0.5 tablets (25 mg total) by mouth at bedtime.   Multiple Vitamins-Minerals (MULTIVITAMIN WITH MINERALS) tablet Take 1 tablet by mouth daily.   rosuvastatin (CRESTOR) 40 MG tablet TAKE ONE TABLET BY MOUTH DAILY   SYMBICORT 160-4.5 MCG/ACT inhaler Inhale 1 puff into the lungs as needed (shortness of breath).   TRINTELLIX 10 MG TABS tablet Take 1 tablet by mouth daily.   VENTOLIN HFA 108 (90 Base) MCG/ACT inhaler Inhale 2 puffs into the lungs every 6 (six) hours as needed for wheezing.     Allergies:   Penicillins and  Sulfa antibiotics   Social History   Socioeconomic History   Marital status: Unknown    Spouse name: Not on file   Number of children: Not on file   Years of education: Not on file   Highest education level: Not on file  Occupational History   Not on file  Tobacco Use   Smoking status: Former    Packs/day: 0.50    Types: Cigarettes    Passive exposure: Past   Smokeless tobacco: Never  Vaping Use   Vaping Use: Former  Substance and Sexual Activity   Alcohol use: Not Currently    Comment: Pt. reports hx. of alcoholism, last ETOH 07/12/16   Drug use: Not Currently   Sexual activity: Not on file  Other Topics Concern   Not on file  Social History Narrative   Not on file   Social Determinants of Health   Financial Resource Strain: Not on file  Food Insecurity: Not on file  Transportation Needs: Not on file  Physical Activity: Not on file  Stress: Not on file  Social Connections: Not on file     Family History: The patient's family history includes Aneurysm  in her father; Diabetes in her brother, brother, brother, and maternal uncle; Healthy in her brother; Heart attack in her brother and brother; Heart disease in her brother and sister; Stroke in her father, mother, sister, and sister. ROS:   Please see the history of present illness.    All other systems reviewed and are negative.  EKGs/Labs/Other Studies Reviewed:    The following studies were reviewed today:  EKG:  EKG ordered today and personally reviewed.  The ekg ordered today demonstrates sinus rhythm normal EKG  Recent Labs: No results found for requested labs within last 365 days.  Recent Lipid Panel    Component Value Date/Time   CHOL 117 03/31/2018 0906   TRIG 87 03/31/2018 0906   HDL 39 (L) 03/31/2018 0906   CHOLHDL 3.0 03/31/2018 0906   VLDL 17 03/31/2018 0906   LDLCALC 61 03/31/2018 0906    Physical Exam:    VS:  BP 112/74 (BP Location: Right Arm, Patient Position: Sitting)   Pulse 90   Ht  5' 1.5" (1.562 m)   Wt 165 lb 3.2 oz (74.9 kg)   SpO2 97%   BMI 30.71 kg/m     Wt Readings from Last 3 Encounters:  06/20/22 165 lb 3.2 oz (74.9 kg)  05/11/21 154 lb (69.9 kg)  05/04/20 145 lb (65.8 kg)     GEN:  Well nourished, well developed in no acute distress HEENT: Normal NECK: No JVD; No carotid bruits LYMPHATICS: No lymphadenopathy CARDIAC: RRR, no murmurs, rubs, gallops RESPIRATORY:  Clear to auscultation without rales, wheezing or rhonchi  ABDOMEN: Soft, non-tender, non-distended MUSCULOSKELETAL:  No edema; No deformity  SKIN: Warm and dry NEUROLOGIC:  Alert and oriented x 3 PSYCHIATRIC:  Normal affect    Signed, Shirlee More, MD  06/20/2022 2:23 PM    [place] [address] [place]

## 2022-06-20 ENCOUNTER — Encounter: Payer: Self-pay | Admitting: Cardiology

## 2022-06-20 ENCOUNTER — Ambulatory Visit: Payer: Medicare HMO | Attending: Cardiology | Admitting: Cardiology

## 2022-06-20 VITALS — BP 112/74 | HR 90 | Ht 61.5 in | Wt 165.2 lb

## 2022-06-20 DIAGNOSIS — I739 Peripheral vascular disease, unspecified: Secondary | ICD-10-CM

## 2022-06-20 DIAGNOSIS — R072 Precordial pain: Secondary | ICD-10-CM

## 2022-06-20 DIAGNOSIS — R079 Chest pain, unspecified: Secondary | ICD-10-CM | POA: Diagnosis not present

## 2022-06-20 DIAGNOSIS — E785 Hyperlipidemia, unspecified: Secondary | ICD-10-CM

## 2022-06-20 DIAGNOSIS — J449 Chronic obstructive pulmonary disease, unspecified: Secondary | ICD-10-CM

## 2022-06-20 DIAGNOSIS — I480 Paroxysmal atrial fibrillation: Secondary | ICD-10-CM | POA: Diagnosis not present

## 2022-06-20 MED ORDER — METOPROLOL TARTRATE 100 MG PO TABS: 100.0000 mg | ORAL_TABLET | Freq: Once | ORAL | 0 refills | Status: DC

## 2022-06-20 NOTE — Patient Instructions (Signed)
Medication Instructions:  Your physician recommends that you continue on your current medications as directed. Please refer to the Current Medication list given to you today.  *If you need a refill on your cardiac medications before your next appointment, please call your pharmacy*   Lab Work: Your physician recommends that you return for lab work in:   Labs 1 week before CT: BMP  If you have labs (blood work) drawn today and your tests are completely normal, you will receive your results only by: [place] (if you have MyChart) OR A paper copy in the mail If you have any lab test that is abnormal or we need to change your treatment, we will call you to review the results.   Testing/Procedures:   Your cardiac CT will be scheduled at one of the below locations:   [facility] [address] [place], [place] [address] (336) [place] [address] [place], [place] [address] [phone]  [facility] [place], [place] [address] [phone]  If scheduled at [facility], please arrive at the [facility] and Children's Entrance (Entrance C2) of [facility] 30 minutes prior to test start time. You can use the FREE valet parking offered at entrance C (encouraged to control the heart rate for the test)  Proceed to the [facility] Radiology Department (first floor) to check-in and test prep.  All radiology patients and guests should use entrance C2 at [facility], accessed from [facility], even though the hospital's physical address listed is [address]    If scheduled at [facility] or [facility], please arrive 15 mins early for check-in and test prep.   Please follow these instructions carefully (unless otherwise directed):  On the Night Before the Test: Be  sure to Drink plenty of water. Do not consume any caffeinated/decaffeinated beverages or chocolate 12 hours prior to your test. Do not take any antihistamines 12 hours prior to your test.  On the Day of the Test: Drink plenty of water until 1 hour prior to the test. You may take your regular medications prior to the test.  Take metoprolol (Lopressor) two hours prior to test. FEMALES- please wear underwire-free bra if available, avoid dresses & tight clothing      After the Test: Drink plenty of water. After receiving IV contrast, you may experience a mild flushed feeling. This is normal. On occasion, you may experience a mild rash up to 24 hours after the test. This is not dangerous. If this occurs, you can take Benadryl 25 mg and increase your fluid intake. If you experience trouble breathing, this can be serious. If it is severe call 911 IMMEDIATELY. If it is mild, please call our office.  We will call to schedule your test 2-4 weeks out understanding that some insurance companies will need an authorization prior to the service being performed.   For non-scheduling related questions, please contact the cardiac imaging nurse navigator should you have any questions/concerns: Marchia Bond, Cardiac Imaging Nurse Navigator Gordy Clement, Cardiac Imaging Nurse Navigator [facility] Direct Office Dial: [phone]   For scheduling needs, including cancellations and rescheduling, please call [place], [phone].    Follow-Up: At [facility], you and your health needs are our priority.  As part of our continuing mission to provide you with exceptional heart  care, we have created designated Provider Care Teams.  These Care Teams include your primary Cardiologist (physician) and Advanced Practice Providers (APPs -  Physician Assistants and Nurse Practitioners) who all work together to provide you with the care you need, when you need it.  We recommend  signing up for the patient portal called "MyChart".  Sign up information is provided on this After Visit Summary.  MyChart is used to connect with patients for Virtual Visits (Telemedicine).  Patients are able to view lab/test results, encounter notes, upcoming appointments, etc.  Non-urgent messages can be sent to your provider as well.   To learn more about what you can do with MyChart, go to [email or web address].    Your next appointment:   1 year(s)  The format for your next appointment:   In Person  Provider:   Shirlee More, MD    Other Instructions None  Important Information About [place]

## 2022-07-11 ENCOUNTER — Telehealth (HOSPITAL_COMMUNITY): Payer: Self-pay | Admitting: Emergency Medicine

## 2022-07-11 ENCOUNTER — Ambulatory Visit
Admission: RE | Admit: 2022-07-11 | Discharge: 2022-07-11 | Disposition: A | Discharge status: Home / Self Care | Payer: Self-pay | Source: Ambulatory Visit | Attending: Family | Admitting: Family

## 2022-07-11 DIAGNOSIS — Z87891 Personal history of nicotine dependence: Secondary | ICD-10-CM

## 2022-07-11 NOTE — Telephone Encounter (Signed)
Reaching out to patient to offer assistance regarding upcoming cardiac imaging study; pt verbalizes understanding of appt date/time, parking situation and where to check in, pre-test NPO status and medications ordered, and verified current allergies; name and call back number provided for further questions should they arise Marchia Bond RN Navigator Cardiac Imaging [facility] [phone] office [phone] cell  Arrival 130 w/c entrance '100mg'$  metoprolol tartrate Holding allergy meds  Denies iv issues

## 2022-07-11 NOTE — Telephone Encounter (Signed)
Attempted to call patient regarding upcoming cardiac CT appointment. °Left message on voicemail with name and callback number °Harpreet Pompey RN Navigator Cardiac Imaging °[facility] °[phone] Office °[phone] Cell ° °

## 2022-07-12 ENCOUNTER — Ambulatory Visit (HOSPITAL_COMMUNITY)
Admission: RE | Admit: 2022-07-12 | Discharge: 2022-07-12 | Disposition: A | Discharge status: Home / Self Care | Payer: Medicare HMO | Source: Ambulatory Visit | Attending: Cardiology | Admitting: Cardiology

## 2022-07-12 DIAGNOSIS — R931 Abnormal findings on diagnostic imaging of heart and coronary circulation: Secondary | ICD-10-CM | POA: Diagnosis present

## 2022-07-12 DIAGNOSIS — R072 Precordial pain: Secondary | ICD-10-CM | POA: Diagnosis present

## 2022-07-12 MED ORDER — NITROGLYCERIN 0.4 MG SL SUBL: 0.8000 mg | SUBLINGUAL_TABLET | Freq: Once | SUBLINGUAL | Status: DC

## 2022-07-12 MED ORDER — NITROGLYCERIN 0.4 MG SL SUBL
SUBLINGUAL_TABLET | SUBLINGUAL | Status: AC
  Filled 2022-07-12: qty 2

## 2022-07-12 MED ORDER — IOHEXOL 350 MG/ML SOLN
100.0000 mL | Freq: Once | INTRAVENOUS | Status: AC | PRN
  Administered 2022-07-12: 95 mL via INTRAVENOUS

## 2022-07-13 ENCOUNTER — Other Ambulatory Visit (HOSPITAL_COMMUNITY): Payer: Self-pay | Admitting: Emergency Medicine

## 2022-07-13 ENCOUNTER — Ambulatory Visit (HOSPITAL_BASED_OUTPATIENT_CLINIC_OR_DEPARTMENT_OTHER)
Admission: RE | Admit: 2022-07-13 | Discharge: 2022-07-13 | Disposition: A | Discharge status: Home / Self Care | Payer: Medicare HMO | Source: Ambulatory Visit | Attending: Cardiology | Admitting: Cardiology

## 2022-07-13 ENCOUNTER — Ambulatory Visit (HOSPITAL_COMMUNITY)
Admission: RE | Admit: 2022-07-13 | Discharge: 2022-07-13 | Disposition: A | Discharge status: Home / Self Care | Payer: Medicare HMO | Source: Ambulatory Visit | Attending: Cardiology | Admitting: Cardiology

## 2022-07-13 DIAGNOSIS — R931 Abnormal findings on diagnostic imaging of heart and coronary circulation: Secondary | ICD-10-CM

## 2022-07-16 ENCOUNTER — Telehealth: Payer: Self-pay

## 2022-07-16 MED ORDER — EZETIMIBE 10 MG PO TABS: 10.0000 mg | ORAL_TABLET | Freq: Every day | ORAL | 0 refills | Status: DC

## 2022-07-16 NOTE — Telephone Encounter (Signed)
Patient notified of results and recommendation and she agreed with plan, Rx sent to confirmed pharmacy

## 2022-07-16 NOTE — Telephone Encounter (Signed)
-----   Message from Richardo Priest, MD sent at 07/15/2022  7:40 PM EDT ----- She has a very high coronary calcium score, I think she would benefit adding Zetia 10 mg to her rosuvastatin.  There is mild plaque in the coronary arteries no blockage or restricting blood flow.  No change in other medications.

## 2022-08-21 ENCOUNTER — Ambulatory Visit: Payer: Medicare HMO | Admitting: Podiatry

## 2022-08-21 ENCOUNTER — Ambulatory Visit (INDEPENDENT_AMBULATORY_CARE_PROVIDER_SITE_OTHER): Payer: Medicare HMO

## 2022-08-21 DIAGNOSIS — M779 Enthesopathy, unspecified: Secondary | ICD-10-CM

## 2022-08-21 DIAGNOSIS — M25371 Other instability, right ankle: Secondary | ICD-10-CM | POA: Diagnosis not present

## 2022-08-21 DIAGNOSIS — M19072 Primary osteoarthritis, left ankle and foot: Secondary | ICD-10-CM | POA: Diagnosis not present

## 2022-08-21 DIAGNOSIS — L84 Corns and callosities: Secondary | ICD-10-CM

## 2022-08-21 DIAGNOSIS — M7751 Other enthesopathy of right foot: Secondary | ICD-10-CM | POA: Diagnosis not present

## 2022-08-21 NOTE — Progress Notes (Unsigned)
Subjective:  Patient ID: Megan Acosta, female    DOB: [birth year],  MRN: [id number]  Chief Complaint  Patient presents with   Ankle Pain    Had surgery 2012. Increase swelling. Patient had 9 pins and a plate. Patient complaining of weakness.    Toe Pain    Toe pain to 2nd toe on left foot. Patient complaining of sharp pain.    Callouses    Callus to left foot on lateral border. Patient is not diabetic.     66 y.o. female presents with multiple concerns related to both the right and left foot.  She has ankle pain on the right side.  Says that the ankle feels unstable to her like it is going to give out and is weak.  She does have a history of having prior ankle fracture repair with hardware still intact on the fibula.  That surgery for ankle fracture repair was in 2012.  She recently had another injury where she rolled her ankle and had severe swelling of the right ankle this was approximately a few months ago.  She notices instability and weakness primarily as her main complaint and feels like the ankle wants to roll.  The swelling has decreased but still having pain and instability there.  She also reports toe pain in the left second toe at the distal joint near the nail.  Has some swelling and discomfort with any motion of the joint.  Also reports a callus at the plantar aspect of the left fifth metatarsal head area that causes some pain with pressure on it she does trim it down with a pumice stone and puts lotion on the area.  Past Medical History:  Diagnosis Date   Alcohol dependence, uncomplicated ([place]) 06/10/2670   Atrial fibrillation ([place]) 01/03/2018   Benzodiazepine dependence ([place]) 07/12/2016   Chest pain 01/03/2018   COPD (chronic obstructive pulmonary disease) ([place]) 01/03/2018   Depression 01/03/2018   Dyspnea on exertion 01/03/2018   Elevated LFTs 07/12/2016   Hyponatremia 07/12/2016   Ovarian cancer ([place]) 01/03/2018   Palpitations 01/03/2018   PVD (peripheral vascular disease)  ([place]) 01/03/2018    Allergies  Allergen Reactions   Penicillins Hives    Patient states she faints after taking medication  Has patient had a PCN reaction causing immediate rash, facial/tongue/throat swelling, SOB or lightheadedness with hypotension: yes Has patient had a PCN reaction causing severe rash involving mucus membranes or skin necrosis: no Has patient had a PCN reaction that required hospitalization:no Has patient had a PCN reaction occurring within the last 10 years: no If all of the above answers are "NO", then may proceed with Cephalosporin use.    Sulfa Antibiotics Hives    ROS: Negative except as per HPI above  Objective:  General: AAO x3, NAD  Dermatological: Hyperkeratotic lesion Sub fifth metatarsal head left foot.  Vascular:  Dorsalis Pedis artery and Posterior Tibial artery pedal pulses are 2/4 bilateral.  Capillary fill time < 3 sec to all digits.   Neruologic: Grossly intact via light touch bilateral. Protective threshold intact to all sites bilateral.   Musculoskeletal: Pain with palpation about the right ankle at the distal fibula and along the course of the ATFL.  There is increased mobility of the right ankle with increased inversion range of motion available.  Anterior drawer test is positive.  No significant edema at this time.  No pain with palpation of the hardware at the lateral aspect of the fibula distally.  On the left  foot there is no to be pain with palpation of the distal interphalangeal joint which does have some palpable osseous spurring and decreased range of motion pain is increased with attempted motion through the DIPJ.  Gait: Unassisted, Nonantalgic.   No images are attached to the encounter.  Radiographs:  Date: 08/21/2022 XR left foot weightbearing AP/Lateral/Oblique   Findings: Attention directed to the distal interphalangeal joint of the second toe there is noted to be severe narrowing of the joint space hard to even discern the  joint line with osteosclerosis and osteophyte formation surrounding the joint.  Consistent with significant osteoarthritic changes of the distal interphalangeal joint of the left second toe.  Also questionable radiolucent fragment at the lateral aspect of the fifth metatarsal neck area however does not show up in all views and does not appear to be large or correlate with area of pain that the patient is having. Assessment:   1. Right ankle instability   2. 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 Callus of foot   4. Bone spur      Plan:  Patient was evaluated and treated and all questions answered.  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 -Also possibility that her retained hardware is bothering her however I find this less likely given her area of pain and symptoms. -I would like to begin with an ankle brace for the right ankle anytime the patient is ambulating to decrease pain and prevent rolling of the ankle or instability that she is feeling. -We will also begin a course of 6 weeks of physical therapy in order to strengthen and retrain the right ankle. -Discussed that if she does not have improvement after physical therapy we would have to consider surgical intervention which would include right ankle lateral ligament reconstruction with internal brace.  Patient may also benefit from hardware removal at that time pending clinical course.  #Left second toe pain distal interphalangeal joint arthritis -Patient has significant severe osteoarthritic changes to the distal interphalangeal joint of the left second toe.   -Discussed that we can try anti-inflammatory medications or gel toe cap conservatively -Long-term solution would be for  surgical intervention including distal interphalangeal joint arthroplasty of the left second toe.  Patient will consider this and we will discuss further at next visit  #Callus plantar aspect fifth metatarsal head left foot All symptomatic hyperkeratoses x1 were safely debrided with a sterile #15 blade to patient's level of comfort without incident. We discussed preventative and palliative care of these lesions including supportive and accommodative shoegear, padding, prefabricated and custom molded accommodative orthoses, use of a pumice stone and lotions/creams daily.   Return in about 6 weeks (around 10/02/2022) for Follow-up right ankle instability and Left second toe pain.          Everitt Amber, DPM [facility], [place]

## 2022-09-11 ENCOUNTER — Telehealth: Payer: Self-pay | Admitting: Cardiology

## 2022-09-11 NOTE — Telephone Encounter (Signed)
Pt c/o swelling: STAT is pt has developed SOB within 24 hours  If swelling, where is the swelling located? Joint swelling   How much weight have you gained and in what time span? No   Have you gained 3 pounds in a day or 5 pounds in a week? No   Do you have a log of your daily weights (if so, list)? N/a   Are you currently taking a fluid pill? No   Are you currently SOB? No   Have you traveled recently? no  Pt calling because her joints are swelled up and she states she has spoken to her PCP about it and they cannot give her any medication for it because she had a stent put in years ago.

## 2022-09-11 NOTE — Telephone Encounter (Signed)
Pt states she has been dx with arthritis in her fingers and toes. Pt states she had a stent 2017 and has been told they do not want to put her on medications due to the stent. Pt states she needs to know what she can take since the stent.  I see that the pt is on Plavix and I guess that is the reason.  Please advise

## 2022-09-25 ENCOUNTER — Telehealth: Payer: Self-pay | Admitting: Cardiology

## 2022-09-25 NOTE — Telephone Encounter (Signed)
Pt c/o medication issue:  1. Name of Medication: clopidogrel (PLAVIX) 75 MG tablet   2. How are you currently taking this medication (dosage and times per day)? 1 tablet daily  3. Are you having a reaction (difficulty breathing--STAT)? no  4. What is your medication issue? Patient states she has arthritis in her fingers. She says her PCP does not want to give her Celebrex, because she is on plavix for her stents. She wants to know if she can stop the plavix to take the celebrex.

## 2022-09-25 NOTE — Telephone Encounter (Signed)
Dr. Romeo Apple reply explained to pt and sent to PCP.

## 2022-10-09 ENCOUNTER — Ambulatory Visit: Payer: Medicare HMO | Admitting: Podiatry

## 2022-11-12 ENCOUNTER — Telehealth: Payer: Self-pay | Admitting: Cardiology

## 2022-11-12 MED ORDER — EZETIMIBE 10 MG PO TABS: 10.0000 mg | ORAL_TABLET | Freq: Every day | ORAL | 3 refills | Status: DC

## 2022-11-12 NOTE — Telephone Encounter (Signed)
Pt c/o medication issue:  1. Name of Medication: Ezetimibe  2. How are you currently taking this medication (dosage and times per day)? 1 time daily  3. Are you having a reaction (difficulty breathing--STAT)?   4. What is your medication issue? Does Dr Bettina Gavia want her to continue taking this medicine? If so, please call a refill in for her please

## 2022-11-12 NOTE — Telephone Encounter (Signed)
Informed patient per Dr Bettina Gavia to continue the Zetia, Rx sent to [place]. Per patient her primary care checks her cholesterol and she will have results faxed for our records. Last results drawn 04/2022 copy in chart media

## 2022-11-22 ENCOUNTER — Telehealth: Payer: Self-pay | Admitting: Cardiology

## 2022-11-22 NOTE — Telephone Encounter (Signed)
Pt c/o medication issue:  1. Name of Medication: Ezetimibe and Rosuvastatin  2. How are you currently taking this medication (dosage and times per day)?   3. Are you having a reaction (difficulty breathing--STAT)?   4. What is your medication issue? The patient  said the pharmacist said that normally Ezetimibe and Rosuvastatin are not taken together.. Patient wants to know if she is supposed to take both of these medicine? I If she is supposed to take both of them, she will need a new prescription for the Rosuvastatin please

## 2022-11-23 ENCOUNTER — Telehealth: Payer: Self-pay

## 2022-11-23 MED ORDER — ROSUVASTATIN CALCIUM 40 MG PO TABS: 40.0000 mg | ORAL_TABLET | Freq: Every day | ORAL | 3 refills | Status: DC

## 2022-11-23 NOTE — Telephone Encounter (Signed)
Spoke with pt and she stated that the pharmacy told her that Crestor and Zetia are not to be taken together. Advised that the Zetia was added to her Crestor per Dr. Joya Gaskins note. Sent Crestor refill to pharmacy.

## 2023-07-17 ENCOUNTER — Other Ambulatory Visit: Payer: Self-pay | Admitting: Family

## 2023-07-17 DIAGNOSIS — Z1231 Encounter for screening mammogram for malignant neoplasm of breast: Secondary | ICD-10-CM

## 2023-07-22 ENCOUNTER — Ambulatory Visit
Admission: RE | Admit: 2023-07-22 | Discharge: 2023-07-22 | Disposition: A | Discharge status: Home / Self Care | Payer: PPO | Source: Ambulatory Visit | Attending: Family | Admitting: Family

## 2023-07-22 DIAGNOSIS — Z1231 Encounter for screening mammogram for malignant neoplasm of breast: Secondary | ICD-10-CM

## 2023-10-11 ENCOUNTER — Other Ambulatory Visit: Payer: Self-pay

## 2023-10-11 MED ORDER — ROSUVASTATIN CALCIUM 40 MG PO TABS: 40.0000 mg | ORAL_TABLET | Freq: Every day | ORAL | 0 refills | Status: DC

## 2023-11-15 ENCOUNTER — Other Ambulatory Visit: Payer: Self-pay | Admitting: Orthopedic Surgery

## 2023-11-15 DIAGNOSIS — M5412 Radiculopathy, cervical region: Secondary | ICD-10-CM

## 2023-11-20 ENCOUNTER — Other Ambulatory Visit: Payer: Self-pay | Admitting: Cardiology

## 2023-11-21 NOTE — Discharge Instructions (Signed)

## 2023-11-22 ENCOUNTER — Ambulatory Visit
Admission: RE | Admit: 2023-11-22 | Discharge: 2023-11-22 | Disposition: A | Discharge status: Home / Self Care | Payer: PPO | Source: Ambulatory Visit | Attending: Orthopedic Surgery | Admitting: Orthopedic Surgery

## 2023-11-22 DIAGNOSIS — M5412 Radiculopathy, cervical region: Secondary | ICD-10-CM

## 2023-11-22 MED ORDER — TRIAMCINOLONE ACETONIDE 40 MG/ML IJ SUSP (RADIOLOGY)
60.0000 mg | Freq: Once | INTRAMUSCULAR | Status: AC
  Administered 2023-11-22: 60 mg via EPIDURAL

## 2023-11-22 MED ORDER — IOPAMIDOL (ISOVUE-M 300) INJECTION 61%
1.0000 mL | Freq: Once | INTRAMUSCULAR | Status: AC | PRN
  Administered 2023-11-22: 1 mL via EPIDURAL

## 2023-12-05 ENCOUNTER — Encounter: Payer: Self-pay | Admitting: Cardiology

## 2023-12-05 NOTE — Progress Notes (Unsigned)
 Cardiology Office Note:    Date:  12/06/2023   ID:  Megan Acosta, Lococo 13-Jun-1956, MRN [id number]  PCP:  Erskine Emery, NP  Cardiologist:  Norman Herrlich, MD    Referring MD: Erskine Emery, NP    ASSESSMENT:    1. Paroxysmal atrial fibrillation (HCC)   2. Coronary artery disease of native artery of native heart with stable angina pectoris (HCC)   3. Dyslipidemia   4. Chronic obstructive pulmonary disease, unspecified COPD type (HCC)   5. PAD (peripheral artery disease) (HCC)    PLAN:    In order of problems listed above:  She now has a Fitbit smart watch she is not setting up we will give her instructions and I asked her to contact me if she has recurrent episodes of documented atrial fibrillation.  She has had some palpitation not severe or sustained and I reinforced with her to go back on clopidogrel Stable CAD not having typical angina we will give her a prescription for nitroglycerin she can take if needed continue treatment including antiplatelet beta-blocker and combined lipid-lowering rosuvastatin and Zetia. Lipids are followed labs PCP office Stable PAD resume clopidogrel Stable COPD   Next appointment: 3 months   Medication Adjustments/Labs and Tests Ordered: Current medicines are reviewed at length with the patient today.  Concerns regarding medicines are outlined above.  Orders Placed This Encounter  Procedures   EKG 12-Lead   No orders of the defined types were placed in this encounter.    History of Present Illness:    Megan Acosta is a 68 y.o. female with a hx of paroxysmal atrial fibrillation with dual antiplatelet therapy for stroke prophylaxis CAD nonobstructive most severe stenosis 40 to 50% mid right coronary artery 20 to 30% proximal mLAD June 2019 peripheral arterial disease dyslipidemia and COPD.  Last seen 06/20/2022.   Compliance with diet, lifestyle and medications: Yes  She had stopped taking aspirin and recently held her  clopidogrel several weeks with a neck injection I told her to get back on clopidogrel. She is having some vague discomfort that she calls indigestion but is not having exertional chest pain has not needed nitroglycerin given a new prescription is not having edema shortness of breath palpitation or syncope She tolerates her statin and Zetia without muscle pain or weakness Past Medical History:  Diagnosis Date   Abnormal myocardial perfusion study 04/21/2018   Abnormal nuclear stress test 03/31/2018   Alcohol dependence, uncomplicated (HCC) 07/12/2016   Atrial fibrillation (HCC) 01/03/2018   Benzodiazepine dependence (HCC) 07/12/2016   Chest pain 01/03/2018   COPD (chronic obstructive pulmonary disease) (HCC) 01/03/2018   Depression 01/03/2018   Dyslipidemia 01/28/2018   Dyspnea on exertion 01/03/2018   Elevated LFTs 07/12/2016   Functional weakness 01/15/2018   Hypokalemia 04/01/2018   Hypomagnesemia 04/01/2018   Hyponatremia 07/12/2016   Hypotension 04/21/2018   Ovarian cancer (HCC) 01/03/2018   PAD (peripheral artery disease) (HCC) 03/28/2018   Palpitations 01/03/2018   Paroxysmal atrial fibrillation (HCC) 01/03/2018   Age 85 and was prescribed quinidine for several yrs, she has had no clinical recurrence.     PVD (peripheral vascular disease) (HCC) 01/03/2018   Right arm pain 04/16/2018   Right sided numbness 01/15/2018   Right sided weakness 01/15/2018   Unstable angina (HCC)     Current Medications: Current Meds  Medication Sig   ALPRAZolam (XANAX) 1 MG tablet Take 1 mg by mouth every 8 (eight) hours as needed for anxiety.  clopidogrel (PLAVIX) 75 MG tablet Take 1 tablet (75 mg total) by mouth daily.   diclofenac (VOLTAREN) 75 MG EC tablet Take 75 mg by mouth 2 (two) times daily.   famotidine (PEPCID) 20 MG tablet TAKE 1 TABLET BY MOUTH TWICE (2) DAILY   fluticasone (FLONASE) 50 MCG/ACT nasal spray Place 1 spray into both nostrils as needed for allergies.   gabapentin  (NEURONTIN) 600 MG tablet Take 600 mg by mouth 3 (three) times daily as needed for pain.   glucosamine-chondroitin 500-400 MG tablet Take 1 tablet by mouth 2 (two) times daily.   magnesium oxide (MAG-OX) 400 (241.3 Mg) MG tablet Take 1 tablet (400 mg total) by mouth 2 (two) times daily.   methocarbamol (ROBAXIN) 750 MG tablet Take 750 mg by mouth 2 (two) times daily as needed.   metoprolol tartrate (LOPRESSOR) 50 MG tablet Take 0.5 tablets (25 mg total) by mouth at bedtime.   Multiple Vitamins-Minerals (MULTIVITAMIN WITH MINERALS) tablet Take 1 tablet by mouth daily.   pantoprazole (PROTONIX) 40 MG tablet Take 40 mg by mouth daily.   rosuvastatin (CRESTOR) 40 MG tablet Take 1 tablet (40 mg total) by mouth daily. Patient needs an appointment for further refills. 2 nd attempt   SYMBICORT 160-4.5 MCG/ACT inhaler Inhale 1 puff into the lungs as needed (shortness of breath).   TRINTELLIX 10 MG TABS tablet Take 1 tablet by mouth daily.   VENTOLIN HFA 108 (90 Base) MCG/ACT inhaler Inhale 2 puffs into the lungs every 6 (six) hours as needed for wheezing.      EKGs/Labs/Other Studies Reviewed:    The following studies were reviewed today:     EKG Interpretation Date/Time:  Friday December 06 2023 14:07:24 EST Ventricular Rate:  95 PR Interval:  162 QRS Duration:  78 QT Interval:  338 QTC Calculation: 424 R Axis:   77  Text Interpretation: Normal sinus rhythm Normal ECG When compared with ECG of 28-Mar-2018 11:00, No significant change was found Confirmed by Norman Herrlich ([id number]) on 12/06/2023 2:12:37 PM    Recent Labs: No results found for requested labs within last 365 days.  Recent Lipid Panel    Component Value Date/Time   CHOL 117 03/31/2018 0906   TRIG 87 03/31/2018 0906   HDL 39 (L) 03/31/2018 0906   CHOLHDL 3.0 03/31/2018 0906   VLDL 17 03/31/2018 0906   LDLCALC 61 03/31/2018 0906    Physical Exam:    VS:  BP 116/62   Pulse 95   Ht 5\' 1"  (1.549 m)   Wt 156 lb 3.2 oz  (70.9 kg)   SpO2 94%   BMI 29.51 kg/m     Wt Readings from Last 3 Encounters:  12/06/23 156 lb 3.2 oz (70.9 kg)  06/20/22 165 lb 3.2 oz (74.9 kg)  05/11/21 154 lb (69.9 kg)     GEN:  Well nourished, well developed in no acute distress HEENT: Normal NECK: No JVD; No carotid bruits LYMPHATICS: No lymphadenopathy CARDIAC: RRR, no murmurs, rubs, gallops RESPIRATORY:  Clear to auscultation without rales, wheezing or rhonchi  ABDOMEN: Soft, non-tender, non-distended MUSCULOSKELETAL:  No edema; No deformity  SKIN: Warm and dry NEUROLOGIC:  Alert and oriented x 3 PSYCHIATRIC:  Normal affect    Signed, Norman Herrlich, MD  12/06/2023 2:24 PM    [facility]

## 2023-12-06 ENCOUNTER — Ambulatory Visit: Payer: PPO | Attending: Cardiology | Admitting: Cardiology

## 2023-12-06 ENCOUNTER — Encounter: Payer: Self-pay | Admitting: Cardiology

## 2023-12-06 VITALS — BP 116/62 | HR 95 | Ht 61.0 in | Wt 156.2 lb

## 2023-12-06 DIAGNOSIS — I739 Peripheral vascular disease, unspecified: Secondary | ICD-10-CM

## 2023-12-06 DIAGNOSIS — I25118 Atherosclerotic heart disease of native coronary artery with other forms of angina pectoris: Secondary | ICD-10-CM

## 2023-12-06 DIAGNOSIS — I480 Paroxysmal atrial fibrillation: Secondary | ICD-10-CM

## 2023-12-06 DIAGNOSIS — J449 Chronic obstructive pulmonary disease, unspecified: Secondary | ICD-10-CM | POA: Diagnosis not present

## 2023-12-06 DIAGNOSIS — E785 Hyperlipidemia, unspecified: Secondary | ICD-10-CM | POA: Diagnosis not present

## 2023-12-06 MED ORDER — NITROGLYCERIN 0.4 MG SL SUBL: 0.4000 mg | SUBLINGUAL_TABLET | SUBLINGUAL | 1 refills | Status: AC | PRN

## 2023-12-06 NOTE — Patient Instructions (Addendum)
 Medication Instructions:  Your physician has recommended you make the following change in your medication:   START: Nitroglycerin 0.4 mg under the tongue every 5 minutes x 3 as need for chest pain.  *If you need a refill on your cardiac medications before your next appointment, please call your pharmacy*   Lab Work: None If you have labs (blood work) drawn today and your tests are completely normal, you will receive your results only by: MyChart Message (if you have MyChart) OR A paper copy in the mail If you have any lab test that is abnormal or we need to change your treatment, we will call you to review the results.   Testing/Procedures: None   Follow-Up: At [facility], you and your health needs are our priority.  As part of our continuing mission to provide you with exceptional heart care, we have created designated Provider Care Teams.  These Care Teams include your primary Cardiologist (physician) and Advanced Practice Providers (APPs -  Physician Assistants and Nurse Practitioners) who all work together to provide you with the care you need, when you need it.  We recommend signing up for the patient portal called "MyChart".  Sign up information is provided on this After Visit Summary.  MyChart is used to connect with patients for Virtual Visits (Telemedicine).  Patients are able to view lab/test results, encounter notes, upcoming appointments, etc.  Non-urgent messages can be sent to your provider as well.   To learn more about what you can do with MyChart, go to [email or web address].    Your next appointment:   1 year(s)  Provider:   Norman Herrlich, MD    Other Instructions Restart Clopidogrel.

## 2024-01-16 ENCOUNTER — Other Ambulatory Visit: Payer: Self-pay

## 2024-01-16 MED ORDER — EZETIMIBE 10 MG PO TABS: 10.0000 mg | ORAL_TABLET | Freq: Every day | ORAL | 2 refills | Status: DC

## 2024-01-27 ENCOUNTER — Telehealth: Payer: Self-pay | Admitting: Cardiology

## 2024-01-27 ENCOUNTER — Other Ambulatory Visit: Payer: Self-pay | Admitting: Cardiology

## 2024-01-27 MED ORDER — ROSUVASTATIN CALCIUM 40 MG PO TABS: 40.0000 mg | ORAL_TABLET | Freq: Every day | ORAL | 2 refills | Status: DC

## 2024-01-27 NOTE — Telephone Encounter (Signed)
*  STAT* If patient is at the pharmacy, call can be transferred to refill team.   1. Which medications need to be refilled? (please list name of each medication and dose if known) rosuvastatin  (CRESTOR ) 40 MG tablet   2. Which pharmacy/location (including street and city if local pharmacy) is medication to be sent to?  [facility] - [place], [place] - [address]    3. Do they need a 30 day or 90 day supply? 90

## 2024-01-29 ENCOUNTER — Other Ambulatory Visit: Payer: Self-pay | Admitting: Orthopedic Surgery

## 2024-01-29 DIAGNOSIS — M5412 Radiculopathy, cervical region: Secondary | ICD-10-CM

## 2024-02-03 ENCOUNTER — Telehealth: Payer: Self-pay

## 2024-02-03 NOTE — Telephone Encounter (Signed)
 Dr. Sandee Crook,  Megan Acosta. [name] 68 year old female is requesting preoperative cardiac evaluation and recommendations for lumbar epidural.  She was recently seen by you in clinic on 12/06/2023.  She reported palpitations that were not severe or sustained.  She was encouraged to resume clopidogrel .  She was also given a as needed nitroglycerin  prescription.  Follow-up was planned for 3 months.  May she hold her Plavix  prior to her upcoming procedure?  Would she be acceptable risk from a cardiac standpoint?  Please direct your response to CV DIV preop pool.  Thank you for your help.  Chet Cota. Hondo Nanda NP-C     02/03/2024, 12:34 PM [facility] [address] Office [phone] Fax [phone]

## 2024-02-03 NOTE — Telephone Encounter (Signed)
   Pre-operative Risk Assessment    Patient Name: Megan Acosta  DOB: 10-19-1955 MRN: [id number]   Date of last office visit: 12/06/23 Date of next office visit: N/A   Request for Surgical Clearance    Procedure:   lumbar epidural  Date of Surgery:  Clearance TBD                                Surgeon:   Surgeon's Group or Practice Name:  [facility] Phone number:  [phone] Fax number:  [phone]   Type of Clearance Requested:   - Pharmacy:  Hold Clopidogrel  (Plavix ) 5 days prior to   Type of Anesthesia:     Additional requests/questions:    Gib Kurk   02/03/2024, 12:01 PM

## 2024-02-11 NOTE — Telephone Encounter (Signed)
   Primary Cardiologist: Zoe Hinds, MD  Chart reviewed as part of pre-operative protocol coverage. Given past medical history and time since last visit, based on ACC/AHA guidelines, Megan Acosta would be at acceptable risk for the planned procedure without further cardiovascular testing.   Patient was advised that if she develops new symptoms prior to surgery to contact our office to arrange a follow-up appointment. She verbalized understanding.  Per office protocol, she may hold Plavix  for 5 days prior to procedure and should resume as soon as hemodynamically stable postoperatively.  I will route this recommendation to the requesting party via Epic fax function and remove from pre-op pool.  Please call with questions.  Gerldine Koch, NP-C 02/11/2024, 7:19 AM [address] [place], [place] [address] Office [phone] Fax [phone]

## 2024-02-14 NOTE — Discharge Instructions (Addendum)
 Post Procedure Spinal Discharge Instruction Sheet  You may resume a regular diet and any medications that you routinely take (including pain medications) unless otherwise noted by MD.  No driving day of procedure.  Light activity throughout the rest of the day.  Do not do any strenuous work, exercise, bending or lifting.  The day following the procedure, you can resume normal physical activity but you should refrain from exercising or physical therapy for at least three days thereafter.  You may apply ice to the injection site, 20 minutes on, 20 minutes off, as needed. Do not apply ice directly to skin.    Common Side Effects:  Headaches- take your usual medications as directed by your physician.  Increase your fluid intake.  Caffeinated beverages may be helpful.  Lie flat in bed until your headache resolves.  Restlessness or inability to sleep- you may have trouble sleeping for the next few days.  Ask your referring physician if you need any medication for sleep.  Facial flushing or redness- should subside within a few days.  Increased pain- a temporary increase in pain a day or two following your procedure is not unusual.  Take your pain medication as prescribed by your referring physician.  Leg cramps  Please contact our office at [phone] for the following symptoms: Fever greater than 100 degrees. Headaches unresolved with medication after 2-3 days. Increased swelling, pain, or redness at injection site.  MAY RESUME TAKING YOUR PLAVIX  TODAY.  Thank you for visiting [facility] today.

## 2024-02-18 ENCOUNTER — Ambulatory Visit
Admission: RE | Admit: 2024-02-18 | Discharge: 2024-02-18 | Disposition: A | Discharge status: Home / Self Care | Source: Ambulatory Visit | Attending: Orthopedic Surgery | Admitting: Orthopedic Surgery

## 2024-02-18 DIAGNOSIS — M5412 Radiculopathy, cervical region: Secondary | ICD-10-CM

## 2024-02-18 MED ORDER — IOPAMIDOL (ISOVUE-M 300) INJECTION 61%
1.0000 mL | Freq: Once | INTRAMUSCULAR | Status: AC | PRN
  Administered 2024-02-18: 1 mL via EPIDURAL

## 2024-02-18 MED ORDER — TRIAMCINOLONE ACETONIDE 40 MG/ML IJ SUSP (RADIOLOGY)
60.0000 mg | Freq: Once | INTRAMUSCULAR | Status: AC
  Administered 2024-02-18: 60 mg via EPIDURAL

## 2024-09-11 ENCOUNTER — Telehealth: Payer: Self-pay

## 2024-09-11 ENCOUNTER — Other Ambulatory Visit: Payer: Self-pay | Admitting: Family

## 2024-09-11 DIAGNOSIS — Z1231 Encounter for screening mammogram for malignant neoplasm of breast: Secondary | ICD-10-CM

## 2024-09-11 NOTE — Telephone Encounter (Signed)
   Patient Name: Megan Acosta  DOB: 07/25/1956 MRN: [id number]  Primary Cardiologist: Redell Leiter, MD  Chart reviewed as part of pre-operative protocol coverage. I called patient who verified she is on clopidogrel , not Eliquis, as per treatment team by Dr. Leiter. The patient affirms she has been doing well without any new cardiac symptoms. Therefore the patient would be at acceptable risk for the planned procedure without further cardiovascular testing. The patient was advised that if she develops new symptoms prior to surgery to contact our office to arrange for a follow-up visit, and she verbalized understanding.  Per Dr. Leiter, Okay to hold Plavix  1 week prior and resume when safe from a bleeding perspective as directed by her surgeon.  Will route this bundled recommendation to requesting provider via Epic fax function. Please call with questions.   Humza Tallerico N Tyrik Stetzer, PA-C 09/11/2024, 2:29 PM

## 2024-09-11 NOTE — Telephone Encounter (Signed)
 Preop received request for cervical epidural steroid injection. I think the entry request to hold Eliquis x 7 days was made in error. Do not see patient is on Eliquis only Plavix . Dr. Judythe note indicates a rec to hold Plavix  x 7 days prior to neuroaxial procedures. Appears patient may be on this per Dr. Monetta for PAD, nonobstructive CAD, and atrial fibrillation - given indications as well as duration of requested hold, will need MD input to do so. Dr. Monetta - Please route response to [facility] PREOP (the pre-op pool). Thank you.

## 2024-09-11 NOTE — Telephone Encounter (Signed)
   Pre-operative Risk Assessment    Patient Name: Megan Acosta  DOB: December 31, 1955 MRN: [id number]   Date of last office visit: 12/06/2023 Date of next office visit: N/A   Request for Surgical Clearance    Procedure:  Cervical interlaminar epidural steroid injection   Date of Surgery:  Clearance TBD                                Surgeon:  Dr. Dorn Bard Surgeon's Group or Practice Name:  [facility] Phone number:  [phone] Fax number:  [phone]   Type of Clearance Requested:   - Pharmacy:  Hold Apixaban (Eliquis) 7 days    Type of Anesthesia:  Not Indicated   Additional requests/questions:    Bonney Calvert Pouch   09/11/2024, 12:00 PM

## 2024-09-15 ENCOUNTER — Inpatient Hospital Stay: Admission: RE | Admit: 2024-09-15 | Discharge: 2024-09-15 | Payer: PPO | Attending: Family | Admitting: Family

## 2024-09-15 DIAGNOSIS — Z1231 Encounter for screening mammogram for malignant neoplasm of breast: Secondary | ICD-10-CM

## 2024-09-22 ENCOUNTER — Other Ambulatory Visit: Payer: Self-pay | Admitting: Family

## 2024-09-22 DIAGNOSIS — R928 Other abnormal and inconclusive findings on diagnostic imaging of breast: Secondary | ICD-10-CM

## 2024-10-07 ENCOUNTER — Ambulatory Visit
Admission: RE | Admit: 2024-10-07 | Discharge: 2024-10-07 | Disposition: A | Discharge status: Home / Self Care | Source: Ambulatory Visit | Attending: Family | Admitting: Family

## 2024-10-07 ENCOUNTER — Ambulatory Visit

## 2024-10-07 DIAGNOSIS — R928 Other abnormal and inconclusive findings on diagnostic imaging of breast: Secondary | ICD-10-CM

## 2024-10-09 ENCOUNTER — Other Ambulatory Visit: Payer: Self-pay | Admitting: Cardiology

## 2024-10-12 MED ORDER — ROSUVASTATIN CALCIUM 40 MG PO TABS: 40.0000 mg | ORAL_TABLET | Freq: Every day | ORAL | 0 refills | Status: AC

## 2024-10-13 ENCOUNTER — Other Ambulatory Visit: Payer: Self-pay

## 2024-10-13 MED ORDER — EZETIMIBE 10 MG PO TABS: 10.0000 mg | ORAL_TABLET | Freq: Every day | ORAL | 0 refills | Status: AC
